# Patient Record
Sex: Female | Born: 1990 | Race: White | Hispanic: No | Marital: Single | Smoking: Current every day smoker
Health system: Southern US, Community
[De-identification: ages and names within clinical notes are randomized; demographics above are authoritative.]

## PROBLEM LIST (undated history)

## (undated) DIAGNOSIS — A749 Chlamydial infection, unspecified: Secondary | ICD-10-CM

## (undated) DIAGNOSIS — F32A Depression, unspecified: Secondary | ICD-10-CM

## (undated) DIAGNOSIS — R599 Enlarged lymph nodes, unspecified: Secondary | ICD-10-CM

## (undated) DIAGNOSIS — G43909 Migraine, unspecified, not intractable, without status migrainosus: Secondary | ICD-10-CM

## (undated) DIAGNOSIS — M549 Dorsalgia, unspecified: Secondary | ICD-10-CM

## (undated) DIAGNOSIS — A5901 Trichomonal vulvovaginitis: Secondary | ICD-10-CM

## (undated) DIAGNOSIS — F329 Major depressive disorder, single episode, unspecified: Secondary | ICD-10-CM

## (undated) DIAGNOSIS — N926 Irregular menstruation, unspecified: Secondary | ICD-10-CM

## (undated) DIAGNOSIS — B009 Herpesviral infection, unspecified: Secondary | ICD-10-CM

## (undated) DIAGNOSIS — K589 Irritable bowel syndrome without diarrhea: Secondary | ICD-10-CM

## (undated) DIAGNOSIS — R109 Unspecified abdominal pain: Secondary | ICD-10-CM

## (undated) DIAGNOSIS — N946 Dysmenorrhea, unspecified: Secondary | ICD-10-CM

## (undated) HISTORY — DX: Trichomonal vulvovaginitis: A59.01

## (undated) HISTORY — DX: Irregular menstruation, unspecified: N92.6

## (undated) HISTORY — DX: Unspecified abdominal pain: R10.9

## (undated) HISTORY — DX: Dysmenorrhea, unspecified: N94.6

## (undated) HISTORY — PX: TONSILLECTOMY: SUR1361

## (undated) HISTORY — DX: Enlarged lymph nodes, unspecified: R59.9

---
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
## 2012-06-14 ENCOUNTER — Emergency Department (HOSPITAL_COMMUNITY)
Admission: EM | Admit: 2012-06-14 | Discharge: 2012-06-14 | Disposition: A | Discharge status: Home / Self Care | Payer: Medicaid Other | Attending: Emergency Medicine | Admitting: Emergency Medicine

## 2012-06-14 ENCOUNTER — Encounter (HOSPITAL_COMMUNITY): Payer: Self-pay | Admitting: *Deleted

## 2012-06-14 ENCOUNTER — Emergency Department (HOSPITAL_COMMUNITY): Payer: Medicaid Other

## 2012-06-14 DIAGNOSIS — R059 Cough, unspecified: Secondary | ICD-10-CM | POA: Insufficient documentation

## 2012-06-14 DIAGNOSIS — J3489 Other specified disorders of nose and nasal sinuses: Secondary | ICD-10-CM | POA: Insufficient documentation

## 2012-06-14 DIAGNOSIS — Z8619 Personal history of other infectious and parasitic diseases: Secondary | ICD-10-CM | POA: Insufficient documentation

## 2012-06-14 DIAGNOSIS — R05 Cough: Secondary | ICD-10-CM | POA: Insufficient documentation

## 2012-06-14 DIAGNOSIS — B349 Viral infection, unspecified: Secondary | ICD-10-CM

## 2012-06-14 DIAGNOSIS — J45909 Unspecified asthma, uncomplicated: Secondary | ICD-10-CM | POA: Insufficient documentation

## 2012-06-14 DIAGNOSIS — J069 Acute upper respiratory infection, unspecified: Secondary | ICD-10-CM | POA: Insufficient documentation

## 2012-06-14 DIAGNOSIS — K029 Dental caries, unspecified: Secondary | ICD-10-CM | POA: Insufficient documentation

## 2012-06-14 DIAGNOSIS — B9789 Other viral agents as the cause of diseases classified elsewhere: Secondary | ICD-10-CM | POA: Insufficient documentation

## 2012-06-14 DIAGNOSIS — Z8659 Personal history of other mental and behavioral disorders: Secondary | ICD-10-CM | POA: Insufficient documentation

## 2012-06-14 DIAGNOSIS — F17200 Nicotine dependence, unspecified, uncomplicated: Secondary | ICD-10-CM | POA: Insufficient documentation

## 2012-06-14 HISTORY — DX: Depression, unspecified: F32.A

## 2012-06-14 HISTORY — DX: Major depressive disorder, single episode, unspecified: F32.9

## 2012-06-14 LAB — RAPID STREP SCREEN (MED CTR MEBANE ONLY): Streptococcus, Group A Screen (Direct): NEGATIVE

## 2012-06-14 MED ORDER — BENZONATATE 100 MG PO CAPS: 100.0000 mg | ORAL_CAPSULE | Freq: Three times a day (TID) | ORAL | Status: DC | PRN

## 2012-06-14 MED ORDER — PENICILLIN V POTASSIUM 250 MG PO TABS: 250.0000 mg | ORAL_TABLET | Freq: Four times a day (QID) | ORAL | Status: DC

## 2012-06-14 MED ORDER — TRAMADOL HCL 50 MG PO TABS: 50.0000 mg | ORAL_TABLET | Freq: Four times a day (QID) | ORAL | Status: DC | PRN

## 2012-06-14 MED ORDER — IBUPROFEN 400 MG PO TABS
400.0000 mg | ORAL_TABLET | Freq: Once | ORAL | Status: AC
  Administered 2012-06-14: 400 mg via ORAL
  Filled 2012-06-14: qty 1

## 2012-06-14 MED ORDER — ACETAMINOPHEN 500 MG PO TABS
1000.0000 mg | ORAL_TABLET | Freq: Once | ORAL | Status: AC
  Administered 2012-06-14: 1000 mg via ORAL
  Filled 2012-06-14: qty 2

## 2012-06-14 MED ORDER — ALBUTEROL SULFATE HFA 108 (90 BASE) MCG/ACT IN AERS: 2.0000 | INHALATION_SPRAY | RESPIRATORY_TRACT | Status: DC | PRN

## 2012-06-14 MED ORDER — DEXAMETHASONE 10 MG/ML FOR PEDIATRIC ORAL USE
10.0000 mg | Freq: Once | INTRAMUSCULAR | Status: AC
  Administered 2012-06-14: 10 mg via ORAL
  Filled 2012-06-14: qty 1

## 2012-06-14 NOTE — ED Notes (Signed)
Fever, cough (productive, pale yellow in color), body aches, sore throat. Symptoms began 3 days ago.

## 2012-06-14 NOTE — ED Provider Notes (Signed)
History     CSN: [id number]  Arrival date & time 06/14/12  1612   First MD Initiated Contact with Patient 06/14/12 1642      Chief Complaint  Patient presents with  . Fever  . Cough    HPI Pt was seen at 1720.   Per pt, c/o gradual onset and persistence of constant subjective fever/chills, sore throat, runny/stuffy nose, sinus congestion, generalized body aches/fatigue and cough for the past 3 days.  Denies rash, no CP/SOB, no N/V/D, no abd pain, no back pain.    Did not get the flu shot this year Past Medical History  Diagnosis Date  . Asthma   . Chlamydia   . Depression     Past Surgical History  Procedure Date  . Tonsillectomy     History  Substance Use Topics  . Smoking status: Current Some Day Smoker  . Smokeless tobacco: Not on file  . Alcohol Use: No    Review of Systems ROS: Statement: All systems negative except as marked or noted in the HPI; Constitutional: +subjective fever and chills, generalized body aches/fatigue. ; ; Eyes: Negative for eye pain, redness and discharge. ; ; ENMT: Negative for ear pain, hoarseness, +nasal congestion, sinus pressure and sore throat. ; ; Cardiovascular: Negative for chest pain, palpitations, diaphoresis, dyspnea and peripheral edema. ; ; Respiratory: +cough. Negative for wheezing and stridor. ; ; Gastrointestinal: Negative for nausea, vomiting, diarrhea, abdominal pain, blood in stool, hematemesis, jaundice and rectal bleeding. . ; ; Genitourinary: Negative for dysuria, flank pain and hematuria. ; ; Musculoskeletal: Negative for back pain and neck pain. Negative for swelling and trauma.; ; Skin: Negative for pruritus, rash, abrasions, blisters, bruising and skin lesion.; ; Neuro: Negative for headache, lightheadedness and neck stiffness. Negative for weakness, altered level of consciousness , altered mental status, extremity weakness, paresthesias, involuntary movement, seizure and syncope.       Allergies  Raspberry and  Other  Home Medications   Current Outpatient Rx  Name  Route  Sig  Dispense  Refill  . ACETAMINOPHEN 500 MG PO TABS   Oral   Take 1,000 mg by mouth every 6 (six) hours as needed. pain         . BENZONATATE 100 MG PO CAPS   Oral   Take 1 capsule (100 mg total) by mouth 3 (three) times daily as needed for cough.   15 capsule   0     BP 126/80  Pulse 107  Temp 98.4 F (36.9 C) (Oral)  Resp 16  Ht 5\' 4"  (1.626 m)  Wt 121 lb 11.2 oz (55.203 kg)  BMI 20.89 kg/m2  SpO2 100%  LMP 06/05/2012  Physical Exam 1725: Physical examination:  Nursing notes reviewed; Vital signs and O2 SAT reviewed;  Constitutional: Well developed, Well nourished, Well hydrated, In no acute distress; Head:  Normocephalic, atraumatic; Eyes: EOMI, PERRL, No scleral icterus; ENMT: TM's clear bilat. +edemetous nasal turbinates bilat with clear rhinorrhea. +mild posterior pharyngeal erythema. Mouth and pharynx without lesions. No tonsillar exudates. No intra-oral edema. No hoarse voice, no drooling, no stridor. No pain with manipulation of larynx. Mouth and pharynx normal, Mucous membranes moist; Neck: Supple, Full range of motion, No lymphadenopathy; Cardiovascular: Regular rate and rhythm, No murmur, rub, or gallop; Respiratory: Breath sounds clear & equal bilaterally, No rales, rhonchi, wheezes.  Speaking full sentences with ease, Normal respiratory effort/excursion; Chest: Nontender, Movement normal; Abdomen: Soft, Nontender, Nondistended, Normal bowel sounds;; Extremities: Pulses normal, No tenderness, No edema,  No calf edema or asymmetry.; Neuro: AA&Ox3, Major CN grossly intact.  Speech clear. No gross focal motor or sensory deficits in extremities.; Skin: Color normal, Warm, Dry.   ED Course  Procedures    MDM  MDM Reviewed: nursing note and vitals Interpretation: labs and x-ray   Results for orders placed during the hospital encounter of 06/14/12  RAPID STREP SCREEN      Component Value Range    Streptococcus, Group A Screen (Direct) NEGATIVE  NEGATIVE   Dg Chest 2 View 06/14/2012  *RADIOLOGY REPORT*  Clinical Data: Cough with sore throat.  CHEST - 2 VIEW  Comparison: 12/24/2009  Findings:  The heart size and mediastinal contours are within normal limits.  Both lungs are clear.  The visualized skeletal structures are unremarkable. No change from priors.  IMPRESSION: No active cardiopulmonary disease.   Original Report Authenticated By: Davonna Belling, M.D.       1735:  Appears viral URI at this time; tx symptomatically.  Pt states she has been going to [facility], and now another family member's home, to visit a newborn child.  Pt and family strongly cautioned regarding not having any further contact with a family member's newborn child while she is ill.  Verb understanding. Dx and testing d/w pt and family.  Questions answered.  Verb understanding, agreeable to d/c home with outpt f/u.   1750:  Pt now c/o "just noticing" a "hole in my tooth" this morning.  ENMT:  Poor dentition, Widespread dental decay, +upper right lateral 2nd molar with dental decay.  No gingival erythema, edema, fluctuance, or drainage. Will tx abx, pain meds; strongly encouraged to f/u with Dentist for definitive care.  Pt also requesting a refill for her albuterol inhaler; rx written.     Laray Anger, DO 06/16/12 1327

## 2012-11-03 ENCOUNTER — Emergency Department (HOSPITAL_COMMUNITY)
Admission: EM | Admit: 2012-11-03 | Discharge: 2012-11-03 | Disposition: A | Discharge status: Home / Self Care | Payer: Medicaid Other | Attending: Emergency Medicine | Admitting: Emergency Medicine

## 2012-11-03 ENCOUNTER — Emergency Department (HOSPITAL_COMMUNITY): Payer: Medicaid Other

## 2012-11-03 ENCOUNTER — Encounter (HOSPITAL_COMMUNITY): Payer: Self-pay | Admitting: *Deleted

## 2012-11-03 DIAGNOSIS — R0789 Other chest pain: Secondary | ICD-10-CM

## 2012-11-03 DIAGNOSIS — Z79899 Other long term (current) drug therapy: Secondary | ICD-10-CM | POA: Insufficient documentation

## 2012-11-03 DIAGNOSIS — R109 Unspecified abdominal pain: Secondary | ICD-10-CM

## 2012-11-03 DIAGNOSIS — M2550 Pain in unspecified joint: Secondary | ICD-10-CM

## 2012-11-03 DIAGNOSIS — Z792 Long term (current) use of antibiotics: Secondary | ICD-10-CM | POA: Insufficient documentation

## 2012-11-03 DIAGNOSIS — S298XXA Other specified injuries of thorax, initial encounter: Secondary | ICD-10-CM | POA: Insufficient documentation

## 2012-11-03 DIAGNOSIS — F17200 Nicotine dependence, unspecified, uncomplicated: Secondary | ICD-10-CM | POA: Insufficient documentation

## 2012-11-03 DIAGNOSIS — S0990XA Unspecified injury of head, initial encounter: Secondary | ICD-10-CM | POA: Insufficient documentation

## 2012-11-03 DIAGNOSIS — J45909 Unspecified asthma, uncomplicated: Secondary | ICD-10-CM | POA: Insufficient documentation

## 2012-11-03 DIAGNOSIS — IMO0002 Reserved for concepts with insufficient information to code with codable children: Secondary | ICD-10-CM | POA: Insufficient documentation

## 2012-11-03 DIAGNOSIS — N898 Other specified noninflammatory disorders of vagina: Secondary | ICD-10-CM | POA: Insufficient documentation

## 2012-11-03 DIAGNOSIS — Z8619 Personal history of other infectious and parasitic diseases: Secondary | ICD-10-CM | POA: Insufficient documentation

## 2012-11-03 DIAGNOSIS — Z8659 Personal history of other mental and behavioral disorders: Secondary | ICD-10-CM | POA: Insufficient documentation

## 2012-11-03 DIAGNOSIS — S3981XA Other specified injuries of abdomen, initial encounter: Secondary | ICD-10-CM | POA: Insufficient documentation

## 2012-11-03 LAB — URINALYSIS, ROUTINE W REFLEX MICROSCOPIC
Glucose, UA: NEGATIVE mg/dL
Ketones, ur: NEGATIVE mg/dL
Leukocytes, UA: NEGATIVE
pH: 6 (ref 5.0–8.0)

## 2012-11-03 LAB — URINE MICROSCOPIC-ADD ON

## 2012-11-03 MED ORDER — IBUPROFEN 800 MG PO TABS
800.0000 mg | ORAL_TABLET | Freq: Once | ORAL | Status: AC
  Administered 2012-11-03: 800 mg via ORAL
  Filled 2012-11-03: qty 1
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
## 2012-11-03 NOTE — ED Notes (Signed)
Ambulated slowly but fully upright to bathroom and back to room
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
## 2012-11-17 ENCOUNTER — Encounter (HOSPITAL_COMMUNITY): Payer: Self-pay | Admitting: *Deleted

## 2012-11-17 ENCOUNTER — Emergency Department (HOSPITAL_COMMUNITY)
Admission: EM | Admit: 2012-11-17 | Discharge: 2012-11-18 | Disposition: A | Discharge status: Home / Self Care | Payer: Medicaid Other | Attending: Emergency Medicine | Admitting: Emergency Medicine

## 2012-11-17 DIAGNOSIS — Y9389 Activity, other specified: Secondary | ICD-10-CM | POA: Insufficient documentation

## 2012-11-17 DIAGNOSIS — Y929 Unspecified place or not applicable: Secondary | ICD-10-CM | POA: Insufficient documentation

## 2012-11-17 DIAGNOSIS — M545 Low back pain: Secondary | ICD-10-CM

## 2012-11-17 DIAGNOSIS — F17200 Nicotine dependence, unspecified, uncomplicated: Secondary | ICD-10-CM | POA: Insufficient documentation

## 2012-11-17 DIAGNOSIS — S29012A Strain of muscle and tendon of back wall of thorax, initial encounter: Secondary | ICD-10-CM

## 2012-11-17 DIAGNOSIS — J45909 Unspecified asthma, uncomplicated: Secondary | ICD-10-CM | POA: Insufficient documentation

## 2012-11-17 DIAGNOSIS — Z8719 Personal history of other diseases of the digestive system: Secondary | ICD-10-CM | POA: Insufficient documentation

## 2012-11-17 DIAGNOSIS — Z8659 Personal history of other mental and behavioral disorders: Secondary | ICD-10-CM | POA: Insufficient documentation

## 2012-11-17 DIAGNOSIS — Z8619 Personal history of other infectious and parasitic diseases: Secondary | ICD-10-CM | POA: Insufficient documentation

## 2012-11-17 DIAGNOSIS — Z8679 Personal history of other diseases of the circulatory system: Secondary | ICD-10-CM | POA: Insufficient documentation

## 2012-11-17 DIAGNOSIS — X500XXA Overexertion from strenuous movement or load, initial encounter: Secondary | ICD-10-CM | POA: Insufficient documentation

## 2012-11-17 DIAGNOSIS — S239XXA Sprain of unspecified parts of thorax, initial encounter: Secondary | ICD-10-CM | POA: Insufficient documentation

## 2012-11-17 DIAGNOSIS — S335XXA Sprain of ligaments of lumbar spine, initial encounter: Secondary | ICD-10-CM | POA: Insufficient documentation

## 2012-11-17 HISTORY — DX: Irritable bowel syndrome, unspecified: K58.9

## 2012-11-17 HISTORY — DX: Dorsalgia, unspecified: M54.9

## 2012-11-17 HISTORY — DX: Migraine, unspecified, not intractable, without status migrainosus: G43.909

## 2012-11-17 NOTE — ED Notes (Signed)
Back pain chronic, but today when helping move a  [employer] , had increase in pain, Crying at triage.

## 2012-11-17 NOTE — ED Provider Notes (Signed)
History    CSN: [id number] Arrival date & time 11/17/12  2315  First MD Initiated Contact with Patient 11/17/12 2330     Chief Complaint  Patient presents with  . Back Pain   (Consider location/radiation/quality/duration/timing/severity/associated sxs/prior Treatment) Patient is a 22 y.o. female presenting with back pain. The history is provided by the patient.  Back Pain Location:  Lumbar spine and thoracic spine Quality:  Aching and stabbing Pain severity:  Severe Onset quality:  Sudden Duration:  3 hours Timing:  Constant Progression:  Unchanged Chronicity:  New Context: lifting heavy objects   Relieved by:  Nothing Associated symptoms: no abdominal pain, no bladder incontinence, no bowel incontinence, no dysuria and no fever  Leg pain: right.    Mary [name] is a 22 y.o. female who presents to the ED with back pain. She states that she has always had back pain due to scoliosis but today while helping move a couch felt a sharp in that started in the upper back and radiates all the way down to the hips and to right leg. She has taken one of her sister's xanax and tylenol without relief. She denies loss of control of bladder or bowels, no nausea or vomiting.  Past Medical History  Diagnosis Date  . Asthma   . Chlamydia   . Depression   . Migraine   . Back pain   . IBS (irritable bowel syndrome)    Past Surgical History  Procedure Laterality Date  . Tonsillectomy     History reviewed. No pertinent family history. History  Substance Use Topics  . Smoking status: Current Some Day Smoker  . Smokeless tobacco: Not on file  . Alcohol Use: No   OB History   Grav Para Term Preterm Abortions TAB SAB Ect Mult Living                 Review of Systems  Constitutional: Negative for fever and chills.  Gastrointestinal: Negative for nausea, vomiting, abdominal pain and bowel incontinence.  Genitourinary: Negative for bladder incontinence and dysuria.  Musculoskeletal:  Positive for back pain.  Skin: Negative for wound.  Neurological: Negative for dizziness.  Psychiatric/Behavioral: The patient is not nervous/anxious.     Allergies  Raspberry and Other  Home Medications   Current Outpatient Rx  Name  Route  Sig  Dispense  Refill  . albuterol (PROVENTIL HFA;VENTOLIN HFA) 108 (90 BASE) MCG/ACT inhaler   Inhalation   Inhale 2 puffs into the lungs every 4 (four) hours as needed for wheezing.   1 Inhaler   0    BP 120/96  Pulse 120  Temp(Src) 99.1 F (37.3 C) (Oral)  Resp 20  Ht 5' (1.524 m)  Wt 121 lb (54.885 kg)  BMI 23.63 kg/m2  SpO2 100%  LMP 11/08/2012 Physical Exam  Nursing note and vitals reviewed. Constitutional: She is oriented to person, place, and time. She appears well-developed and well-nourished. No distress.  HENT:  Head: Normocephalic.  Eyes: EOM are normal.  Neck: Normal range of motion. Neck supple.  Cardiovascular: Normal rate and regular rhythm.   Pulmonary/Chest: Effort normal and breath sounds normal.  Abdominal: Soft. Bowel sounds are normal. There is no tenderness.  Musculoskeletal:       Lumbar back: She exhibits decreased range of motion, tenderness and spasm. She exhibits normal pulse.       Back:  Radial and pedal pulses strong. Adequate circulation. Good touch sensation.  Neurological: She is alert and oriented to  person, place, and time. She has normal strength and normal reflexes. No cranial nerve deficit or sensory deficit. Gait normal.  Skin: Skin is warm and dry.  Multiple tattoos and piercing   Psychiatric: She has a normal mood and affect. Her behavior is normal.    ED Course  Procedures  MDM  22 y.o. female with muscle strain and spasm of thoracic and lumbar area. Will treat with muscle relaxants and pain management and she will follow up with Dr. Hilda Lias. She has seen him in the past with her back problems.  Discussed with the patient clinical findings and plan of care. All questioned fully  answered. She will return if any problems arise.    Medication List    TAKE these medications       cyclobenzaprine 10 MG tablet  Commonly known as:  FLEXERIL  Take 1 tablet (10 mg total) by mouth 2 (two) times daily as needed for muscle spasms.     HYDROcodone-acetaminophen 5-325 MG per tablet  Commonly known as:  NORCO/VICODIN  Take 1 tablet by mouth every 4 (four) hours as needed.      ASK your doctor about these medications       albuterol 108 (90 BASE) MCG/ACT inhaler  Commonly known as:  PROVENTIL HFA;VENTOLIN HFA  Inhale 2 puffs into the lungs every 4 (four) hours as needed for wheezing.           [facility] Orlene Och, [place] 11/18/12 1836

## 2012-11-18 ENCOUNTER — Other Ambulatory Visit (HOSPITAL_COMMUNITY): Payer: Self-pay | Admitting: Family Medicine

## 2012-11-18 ENCOUNTER — Ambulatory Visit (HOSPITAL_COMMUNITY)
Admission: RE | Admit: 2012-11-18 | Discharge: 2012-11-18 | Disposition: A | Discharge status: Home / Self Care | Payer: Medicaid Other | Source: Ambulatory Visit | Attending: Family Medicine | Admitting: Family Medicine

## 2012-11-18 DIAGNOSIS — IMO0002 Reserved for concepts with insufficient information to code with codable children: Secondary | ICD-10-CM

## 2012-11-18 DIAGNOSIS — M4120 Other idiopathic scoliosis, site unspecified: Secondary | ICD-10-CM | POA: Insufficient documentation

## 2012-11-18 DIAGNOSIS — M546 Pain in thoracic spine: Secondary | ICD-10-CM | POA: Insufficient documentation

## 2012-11-18 DIAGNOSIS — M545 Low back pain, unspecified: Secondary | ICD-10-CM | POA: Insufficient documentation

## 2012-11-18 MED ORDER — HYDROCODONE-ACETAMINOPHEN 5-325 MG PO TABS: 1.0000 | ORAL_TABLET | ORAL | Status: DC | PRN

## 2012-11-18 MED ORDER — HYDROCODONE-ACETAMINOPHEN 5-325 MG PO TABS
1.0000 | ORAL_TABLET | Freq: Once | ORAL | Status: AC
  Administered 2012-11-18: 1 via ORAL
  Filled 2012-11-18: qty 1

## 2012-11-18 MED ORDER — CYCLOBENZAPRINE HCL 10 MG PO TABS: 10.0000 mg | ORAL_TABLET | Freq: Two times a day (BID) | ORAL | Status: DC | PRN

## 2012-11-18 MED ORDER — CYCLOBENZAPRINE HCL 10 MG PO TABS
10.0000 mg | ORAL_TABLET | Freq: Once | ORAL | Status: AC
  Administered 2012-11-18: 10 mg via ORAL
  Filled 2012-11-18: qty 1

## 2012-11-19 NOTE — ED Provider Notes (Signed)
Medical screening examination/treatment/procedure(s) were performed by non-physician practitioner and as supervising physician I was immediately available for consultation/collaboration.  Nicoletta Dress. Colon Branch, MD 11/19/12 [phone]

## 2012-12-01 ENCOUNTER — Emergency Department (HOSPITAL_COMMUNITY): Payer: Medicaid Other

## 2012-12-01 ENCOUNTER — Emergency Department (HOSPITAL_COMMUNITY)
Admission: EM | Admit: 2012-12-01 | Discharge: 2012-12-01 | Disposition: A | Discharge status: Home / Self Care | Payer: Medicaid Other | Attending: Emergency Medicine | Admitting: Emergency Medicine

## 2012-12-01 ENCOUNTER — Encounter (HOSPITAL_COMMUNITY): Payer: Self-pay

## 2012-12-01 DIAGNOSIS — J45901 Unspecified asthma with (acute) exacerbation: Secondary | ICD-10-CM | POA: Insufficient documentation

## 2012-12-01 DIAGNOSIS — Z8719 Personal history of other diseases of the digestive system: Secondary | ICD-10-CM | POA: Insufficient documentation

## 2012-12-01 DIAGNOSIS — J209 Acute bronchitis, unspecified: Secondary | ICD-10-CM | POA: Insufficient documentation

## 2012-12-01 DIAGNOSIS — F3289 Other specified depressive episodes: Secondary | ICD-10-CM | POA: Insufficient documentation

## 2012-12-01 DIAGNOSIS — Z8619 Personal history of other infectious and parasitic diseases: Secondary | ICD-10-CM | POA: Insufficient documentation

## 2012-12-01 DIAGNOSIS — F329 Major depressive disorder, single episode, unspecified: Secondary | ICD-10-CM | POA: Insufficient documentation

## 2012-12-01 DIAGNOSIS — F17200 Nicotine dependence, unspecified, uncomplicated: Secondary | ICD-10-CM | POA: Insufficient documentation

## 2012-12-01 DIAGNOSIS — Z8679 Personal history of other diseases of the circulatory system: Secondary | ICD-10-CM | POA: Insufficient documentation

## 2012-12-01 DIAGNOSIS — Z79899 Other long term (current) drug therapy: Secondary | ICD-10-CM | POA: Insufficient documentation

## 2012-12-01 DIAGNOSIS — Z8739 Personal history of other diseases of the musculoskeletal system and connective tissue: Secondary | ICD-10-CM | POA: Insufficient documentation

## 2012-12-01 DIAGNOSIS — J40 Bronchitis, not specified as acute or chronic: Secondary | ICD-10-CM

## 2012-12-01 DIAGNOSIS — R51 Headache: Secondary | ICD-10-CM | POA: Insufficient documentation

## 2012-12-01 MED ORDER — HYDROCODONE-ACETAMINOPHEN 5-325 MG PO TABS
1.0000 | ORAL_TABLET | Freq: Once | ORAL | Status: AC
  Administered 2012-12-01: 1 via ORAL
  Filled 2012-12-01: qty 1

## 2012-12-01 MED ORDER — AMOXICILLIN 500 MG PO CAPS: 500.0000 mg | ORAL_CAPSULE | Freq: Three times a day (TID) | ORAL | Status: DC

## 2012-12-01 MED ORDER — TRAMADOL HCL 50 MG PO TABS: 50.0000 mg | ORAL_TABLET | Freq: Four times a day (QID) | ORAL | Status: DC | PRN
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
## 2012-12-01 NOTE — ED Notes (Signed)
Pt reports cough for 4 days, and headache that started today, inhaler is nit helping her. Chills at times, sob. Coughing up yellow mucus, no fever.

## 2013-02-20 ENCOUNTER — Encounter (HOSPITAL_COMMUNITY): Payer: Self-pay | Admitting: *Deleted

## 2013-02-20 ENCOUNTER — Emergency Department (HOSPITAL_COMMUNITY)
Admission: EM | Admit: 2013-02-20 | Discharge: 2013-02-20 | Disposition: A | Discharge status: Home / Self Care | Payer: Medicaid Other | Attending: Emergency Medicine | Admitting: Emergency Medicine

## 2013-02-20 DIAGNOSIS — Z8619 Personal history of other infectious and parasitic diseases: Secondary | ICD-10-CM | POA: Insufficient documentation

## 2013-02-20 DIAGNOSIS — G43909 Migraine, unspecified, not intractable, without status migrainosus: Secondary | ICD-10-CM | POA: Insufficient documentation

## 2013-02-20 DIAGNOSIS — R5381 Other malaise: Secondary | ICD-10-CM | POA: Insufficient documentation

## 2013-02-20 DIAGNOSIS — F329 Major depressive disorder, single episode, unspecified: Secondary | ICD-10-CM | POA: Insufficient documentation

## 2013-02-20 DIAGNOSIS — Z792 Long term (current) use of antibiotics: Secondary | ICD-10-CM | POA: Insufficient documentation

## 2013-02-20 DIAGNOSIS — Z79899 Other long term (current) drug therapy: Secondary | ICD-10-CM | POA: Insufficient documentation

## 2013-02-20 DIAGNOSIS — Z8719 Personal history of other diseases of the digestive system: Secondary | ICD-10-CM | POA: Insufficient documentation

## 2013-02-20 DIAGNOSIS — J45909 Unspecified asthma, uncomplicated: Secondary | ICD-10-CM | POA: Insufficient documentation

## 2013-02-20 DIAGNOSIS — F3289 Other specified depressive episodes: Secondary | ICD-10-CM | POA: Insufficient documentation

## 2013-02-20 DIAGNOSIS — F17200 Nicotine dependence, unspecified, uncomplicated: Secondary | ICD-10-CM | POA: Insufficient documentation

## 2013-02-20 DIAGNOSIS — R531 Weakness: Secondary | ICD-10-CM

## 2013-02-20 DIAGNOSIS — R1110 Vomiting, unspecified: Secondary | ICD-10-CM | POA: Insufficient documentation

## 2013-02-20 LAB — CBC WITH DIFFERENTIAL/PLATELET
Basophils Absolute: 0 10*3/uL (ref 0.0–0.1)
Basophils Relative: 0 % (ref 0–1)
HCT: 35.4 % — ABNORMAL LOW (ref 36.0–46.0)
Hemoglobin: 11.5 g/dL — ABNORMAL LOW (ref 12.0–15.0)
Lymphocytes Relative: 42 % (ref 12–46)
Lymphs Abs: 2.8 10*3/uL (ref 0.7–4.0)
Monocytes Absolute: 0.6 10*3/uL (ref 0.1–1.0)
Monocytes Relative: 9 % (ref 3–12)
Neutro Abs: 3 10*3/uL (ref 1.7–7.7)
Neutrophils Relative %: 45 % (ref 43–77)
RBC: 3.98 MIL/uL (ref 3.87–5.11)
WBC: 6.7 10*3/uL (ref 4.0–10.5)

## 2013-02-20 LAB — RAPID URINE DRUG SCREEN, HOSP PERFORMED
Amphetamines: NOT DETECTED
Barbiturates: NOT DETECTED
Benzodiazepines: NOT DETECTED
Cocaine: NOT DETECTED
Opiates: NOT DETECTED

## 2013-02-20 LAB — GLUCOSE, CAPILLARY: Glucose-Capillary: 118 mg/dL — ABNORMAL HIGH (ref 70–99)

## 2013-02-20 LAB — BASIC METABOLIC PANEL
BUN: 6 mg/dL (ref 6–23)
CO2: 25 mEq/L (ref 19–32)
Chloride: 108 mEq/L (ref 96–112)
Creatinine, Ser: 0.8 mg/dL (ref 0.50–1.10)
GFR calc Af Amer: >90 mL/min (ref >90)
Potassium: 3 mEq/L — ABNORMAL LOW (ref 3.5–5.1)
Sodium: 144 mEq/L (ref 135–145)

## 2013-02-20 LAB — ETHANOL: Alcohol, Ethyl (B): <11 mg/dL (ref 0–11)

## 2013-02-20 LAB — SALICYLATE LEVEL: Salicylate Lvl: <2 mg/dL — ABNORMAL LOW (ref 2.8–20.0)

## 2013-02-20 MED ORDER — SODIUM CHLORIDE 0.9 % IV BOLUS (SEPSIS)
1000.0000 mL | Freq: Once | INTRAVENOUS | Status: AC
  Administered 2013-02-20: 1000 mL via INTRAVENOUS

## 2013-02-20 MED ORDER — NALOXONE HCL 1 MG/ML IJ SOLN
1.0000 mg | Freq: Once | INTRAMUSCULAR | Status: AC
  Administered 2013-02-20: 1 mg via INTRAVENOUS

## 2013-02-20 MED ORDER — NALOXONE HCL 1 MG/ML IJ SOLN
INTRAMUSCULAR | Status: AC
  Administered 2013-02-20: 1 mg via INTRAVENOUS
  Filled 2013-02-20: qty 2

## 2013-02-20 NOTE — ED Provider Notes (Signed)
CSN: [id number]     Arrival date & time 02/20/13  0712 History  This chart was scribed for Mary Hutching, MD by Shari Heritage, ED Scribe. The patient was seen in room APA16A/APA16A. Patient's care was started at 7:28 AM.    Chief Complaint  Patient presents with  . Alcohol Intoxication    The history is provided by the EMS personnel. The history is limited by the condition of the patient. No language interpreter was used.   Level 5 Caveat - Unable to obtain complete history due to patient's condition, unresponsive HPI Comments: Mary [name] is a 22 y.o. female who presents to the Emergency Department brought in by EMS after being found on the side of the road vomiting. Per EMS report, while patient was being transported, she fell asleep after previously being alert. She was given Narcan 2 mg en route, but she did not respond to this medicine. The officer on the scene believed she had been using alcohol. Patient has not provided any information at this time.    Past Medical History  Diagnosis Date  . Asthma   . Chlamydia   . Depression   . Migraine   . Back pain   . IBS (irritable bowel syndrome)    Past Surgical History  Procedure Laterality Date  . Tonsillectomy     No family history on file. History  Substance Use Topics  . Smoking status: Current Some Day Smoker    Types: Cigarettes  . Smokeless tobacco: Not on file  . Alcohol Use: No   OB History   Grav Para Term Preterm Abortions TAB SAB Ect Mult Living                 Review of Systems Level 5 Caveat - Unable to complete ROS due to patient's condition, unresponsive  Allergies  Raspberry and Other  Home Medications   Current Outpatient Rx  Name  Route  Sig  Dispense  Refill  . acetaminophen (TYLENOL) 500 MG tablet   Oral   Take 500 mg by mouth every 6 (six) hours as needed for pain.         Marland Kitchen albuterol (PROVENTIL HFA;VENTOLIN HFA) 108 (90 BASE) MCG/ACT inhaler   Inhalation   Inhale 2 puffs into the lungs  every 4 (four) hours as needed for wheezing.   1 Inhaler   0   . amoxicillin (AMOXIL) 500 MG capsule   Oral   Take 1 capsule (500 mg total) by mouth 3 (three) times daily.   21 capsule   0   . cyclobenzaprine (FLEXERIL) 10 MG tablet   Oral   Take 10 mg by mouth 3 (three) times daily as needed for muscle spasms.         Marland Kitchen FLUoxetine (PROZAC) 20 MG capsule   Oral   Take 20 mg by mouth daily.         . traMADol (ULTRAM) 50 MG tablet   Oral   Take 1 tablet (50 mg total) by mouth every 6 (six) hours as needed for pain.   15 tablet   0    Triage Vitals: BP 139/88  Pulse 61  Temp(Src) 97.3 F (36.3 C) (Oral)  Resp 21  SpO2 93% Physical Exam  Nursing note and vitals reviewed. Constitutional: She appears well-developed and well-nourished.  HENT:  Head: Normocephalic and atraumatic.  Eyes: Conjunctivae and EOM are normal. Pupils are equal, round, and reactive to light.  Neck: Normal range of motion. Neck  supple.  Cardiovascular: Normal rate, regular rhythm and normal heart sounds.   Pulmonary/Chest: Effort normal and breath sounds normal.  Abdominal: Soft. Bowel sounds are normal.  Musculoskeletal: Normal range of motion.  Neurological:  Unable to assess.  Skin: Skin is warm and dry.  Psychiatric: Cognition and memory are impaired.  Unable to assess due to patient's condition.    ED Course  Procedures (including critical care time) DIAGNOSTIC STUDIES: Oxygen Saturation is 93% on room air, adequate by my interpretation.    COORDINATION OF CARE: 7:32 AM- Patient brought in by EMS unresponsive. Will order IV fluids, glucose, acetaminophen level, CBC with diff, BMP, ethanol, drug screen and salicylate level.   8:43 AM- Per ED staff, [place] [employer] located patient's purse which contained heroin paraphernalia.   Labs Review Labs Reviewed  CBC WITH DIFFERENTIAL - Abnormal; Notable for the following:    Hemoglobin 11.5 (*)    HCT 35.4 (*)    All other components  within normal limits  BASIC METABOLIC PANEL - Abnormal; Notable for the following:    Potassium 3.0 (*)    Glucose, Bld 126 (*)    All other components within normal limits  URINE RAPID DRUG SCREEN (HOSP PERFORMED) - Abnormal; Notable for the following:    Tetrahydrocannabinol POSITIVE (*)    All other components within normal limits  SALICYLATE LEVEL - Abnormal; Notable for the following:    Salicylate Lvl <2.0 (*)    All other components within normal limits  GLUCOSE, CAPILLARY - Abnormal; Notable for the following:    Glucose-Capillary 118 (*)    All other components within normal limits  ETHANOL  ACETAMINOPHEN LEVEL    Imaging Review No results found.  MDM  No diagnosis found. Patient rechecked at multiple times throughout her emergency department course.  She seemed to have a good response to Narcan, however no opiates were noted in her drug screen.   At discharge she was alert and ambulatory. No neuro deficits. Vital signs are stable.   I personally performed the services described in this documentation, which was scribed in my presence. The recorded information has been reviewed and is accurate.   Mary Hutching, MD 02/20/13 [phone]

## 2013-02-20 NOTE — ED Notes (Signed)
Pt was standing on the side of the road vomiting when EMS arrived. EMS states that pt was awake and alert when they first arrived but she fell to sleep after laying on the stretcher. Narcan 2mg  was given with no response by EMS. Officer on scene stated that pt had been drinking. Pt is not providing any information at this time.

## 2013-02-20 NOTE — ED Notes (Signed)
Patient lying in bed. No distress. Noted snoring. Per EMS patient found stumbling down side of road.

## 2013-02-20 NOTE — ED Notes (Signed)
Patient resting eyes closed. Respirations even and unlabored.

## 2013-02-20 NOTE — ED Notes (Signed)
Patient arouses to voice. Patient still very drowsy, but carries conversation with RN without difficulty. Vital signs obtained and stable.

## 2013-02-20 NOTE — ED Notes (Signed)
Patient agitated and complaining of pain in legs. More alert. Will reluctantly talk to nurse's at bedside. States she has "restless leg" and cramping in legs. Patient states she takes oxycodone. Shakes head yes to illegal drug question, but will not state what she takes.

## 2013-02-20 NOTE — ED Notes (Signed)
Patient with no complaints at this time. Respirations even and unlabored. Skin warm/dry. Discharge instructions reviewed with patient at this time. Patient given opportunity to voice concerns/ask questions. IV removed per policy and band-aid applied to site. Patient discharged at this time and left Emergency Department with steady gait.  

## 2013-02-20 NOTE — ED Notes (Signed)
Phoned lab re: labs pending for an hour. Phlebotomist overhead paged.

## 2013-02-20 NOTE — ED Notes (Addendum)
[employer] at bedside. Found patient's purse in road near where patient was found. Drug paraphernalia found by [employer] in purse (needles, syringes, burnt spoons). Dr Adriana Simas made aware of PD findings. Verbal orders for 1 mg narcan IV and second 1000 ml normal saline bolus obtained. Patient purse locked up in ED locker.   Track markes noted to right posterior, medial hand.

## 2013-02-20 NOTE — ED Notes (Signed)
Patient awake. Mother at bedside to give patient ride home.

## 2013-02-20 NOTE — ED Notes (Signed)
Patient cloths were wet upon arrival changed patient linens and gown. Patient is asleep and resting at this time.

## 2013-02-20 NOTE — ED Notes (Signed)
Increased patient movement after narcan injection.

## 2013-02-20 NOTE — ED Notes (Signed)
Resting. Hypotensive. Respirations even and unlabored. No distress

## 2013-06-04 ENCOUNTER — Encounter (INDEPENDENT_AMBULATORY_CARE_PROVIDER_SITE_OTHER): Payer: Self-pay

## 2013-06-04 ENCOUNTER — Ambulatory Visit (INDEPENDENT_AMBULATORY_CARE_PROVIDER_SITE_OTHER): Payer: Medicaid Other | Admitting: Adult Health

## 2013-06-04 ENCOUNTER — Encounter: Payer: Self-pay | Admitting: Adult Health

## 2013-06-04 VITALS — BP 120/76 | HR 78 | Temp 98.6°F | Ht 60.0 in | Wt 117.0 lb

## 2013-06-04 DIAGNOSIS — N898 Other specified noninflammatory disorders of vagina: Secondary | ICD-10-CM

## 2013-06-04 DIAGNOSIS — A5901 Trichomonal vulvovaginitis: Secondary | ICD-10-CM

## 2013-06-04 DIAGNOSIS — N949 Unspecified condition associated with female genital organs and menstrual cycle: Secondary | ICD-10-CM

## 2013-06-04 DIAGNOSIS — N946 Dysmenorrhea, unspecified: Secondary | ICD-10-CM | POA: Insufficient documentation

## 2013-06-04 DIAGNOSIS — Z3202 Encounter for pregnancy test, result negative: Secondary | ICD-10-CM

## 2013-06-04 DIAGNOSIS — Z3200 Encounter for pregnancy test, result unknown: Secondary | ICD-10-CM

## 2013-06-04 DIAGNOSIS — R599 Enlarged lymph nodes, unspecified: Secondary | ICD-10-CM

## 2013-06-04 HISTORY — DX: Dysmenorrhea, unspecified: N94.6

## 2013-06-04 HISTORY — DX: Trichomonal vulvovaginitis: A59.01

## 2013-06-04 HISTORY — DX: Enlarged lymph nodes, unspecified: R59.9

## 2013-06-04 LAB — CBC
HCT: 36.6 % (ref 36.0–46.0)
Hemoglobin: 12.6 g/dL (ref 12.0–15.0)
MCH: 28.4 pg (ref 26.0–34.0)
MCHC: 34.4 g/dL (ref 30.0–36.0)
MCV: 82.4 fL (ref 78.0–100.0)
PLATELETS: 177 10*3/uL (ref 150–400)
RBC: 4.44 MIL/uL (ref 3.87–5.11)
RDW: 15 % (ref 11.5–15.5)
WBC: 10.8 10*3/uL — ABNORMAL HIGH (ref 4.0–10.5)

## 2013-06-04 LAB — POCT URINE PREGNANCY: Preg Test, Ur: NEGATIVE

## 2013-06-04 MED ORDER — HYDROCODONE-ACETAMINOPHEN 10-325 MG PO TABS: 1.0000 | ORAL_TABLET | Freq: Four times a day (QID) | ORAL | Status: DC | PRN

## 2013-06-04 MED ORDER — AZITHROMYCIN 250 MG PO TABS: ORAL_TABLET | ORAL | Status: DC

## 2013-06-04 MED ORDER — CEFTRIAXONE SODIUM 1 G IJ SOLR
1.0000 g | Freq: Once | INTRAMUSCULAR | Status: AC
  Administered 2013-06-04: 1 g via INTRAMUSCULAR

## 2013-06-04 MED ORDER — METRONIDAZOLE 500 MG PO TABS: ORAL_TABLET | ORAL | Status: DC

## 2013-06-04 NOTE — Progress Notes (Signed)
Subjective:     Patient ID: Mary [name], female   DOB: 1990-06-07, 23 y.o.   MRN: [id number]  HPI Mary [name] is a 23 year old white female in complaining of ingrown hair and painful periods and pain in pelvic area, ? Swollen glands.Had sex about a week ago.Pt popped area where hair was and it is better.  Review of Systems See HPI Reviewed past medical,surgical, social and family history. Reviewed medications and allergies.     Objective:   Physical Exam BP 120/76  Pulse 78  Temp(Src) 98.6 F (37 C) (Oral)  Ht 5' (1.524 m)  Wt 117 lb (53.071 kg)  BMI 22.85 kg/m2  LMP 01/07/2015UPT negative, Skin warm and dry.Pelvic: external genitalia is normal in appearance,Has enlarged glands bilaterally L>R,and tender, vagina: grayish frothy discharge with odor, cervix:smooth and bulbous,negative CMT, uterus: normal size, shape and contour, tender, no masses felt, adnexa: no masses, some tenderness noted. Wet prep: + for trich and +WBCs. GC/CHL obtained.     Assessment:    Vaginal discharge Trichomonas Enlarged bilateral groin lymph nodes Dysmenorrhea Pelvic pain    Plan:     Rocephin 1 gm IM in left hip per A Travis LPN Rx Flagyl 277 mg # 4  4 po now, no alcohol Rx azithromycin 250 mg # 4 4 po about 1 hour after flagyl, no sex Check CBC,ESR and GC/CHL Rx norco 10/325 mg #30 1 every 6 hours prn no refills follow up in 1 day Review handout on trich and tell partners to be treated

## 2013-06-04 NOTE — Patient Instructions (Addendum)
Take flagyl 4 tabs now then in about 1 hour take 4 Azithromycin po, no alcohol or sex, tell partners to get treated for trich Trichomoniasis Trichomoniasis is an infection, caused by the Trichomonas organism, that affects both women and men. In women, the outer female genitalia and the vagina are affected. In men, the penis is mainly affected, but the prostate and other reproductive organs can also be involved. Trichomoniasis is a sexually transmitted disease (STD) and is most often passed to another person through sexual contact. The majority of people who get trichomoniasis do so from a sexual encounter and are also at risk for other STDs. CAUSES   Sexual intercourse with an infected partner.  It can be present in swimming pools or hot tubs. SYMPTOMS   Abnormal gray-green frothy vaginal discharge in women.  Vaginal itching and irritation in women.  Itching and irritation of the area outside the vagina in women.  Penile discharge with or without pain in males.  Inflammation of the urethra (urethritis), causing painful urination.  Bleeding after sexual intercourse. RELATED COMPLICATIONS  Pelvic inflammatory disease.  Infection of the uterus (endometritis).  Infertility.  Tubal (ectopic) pregnancy.  It can be associated with other STDs, including gonorrhea and chlamydia, hepatitis B, and HIV. COMPLICATIONS DURING PREGNANCY  Early (premature) delivery.  Premature rupture of the membranes (PROM).  Low birth weight. DIAGNOSIS   Visualization of Trichomonas under the microscope from the vagina discharge.  Ph of the vagina greater than 4.5, tested with a test tape.  Trich Rapid Test.  Culture of the organism, but this is not usually needed.  It may be found on a Pap test.  Having a "strawberry cervix,"which means the cervix looks very red like a strawberry. TREATMENT   You may be given medication to fight the infection. Inform your caregiver if you could be or are  pregnant. Some medications used to treat the infection should not be taken during pregnancy.  Over-the-counter medications or creams to decrease itching or irritation may be recommended.  Your sexual partner will need to be treated if infected. HOME CARE INSTRUCTIONS   Take all medication prescribed by your caregiver.  Take over-the-counter medication for itching or irritation as directed by your caregiver.  Do not have sexual intercourse while you have the infection.  Do not douche or wear tampons.  Discuss your infection with your partner, as your partner may have acquired the infection from you. Or, your partner may have been the person who transmitted the infection to you.  Have your sex partner examined and treated if necessary.  Practice safe, informed, and protected sex.  See your caregiver for other STD testing. SEEK MEDICAL CARE IF:   You still have symptoms after you finish the medication.  You have an oral temperature above 102 F (38.9 C).  You develop belly (abdominal) pain.  You have pain when you urinate.  You have bleeding after sexual intercourse.  You develop a rash.  The medication makes you sick or makes you throw up (vomit). Document Released: 11/07/2000 Document Revised: 08/06/2011 Document Reviewed: 12/03/2008 [facility]Exit[facility] Patient Information 2014 [place]Care, [place].  Take only 1 norco every 6 hours Follow up in 1 day

## 2013-06-05 ENCOUNTER — Ambulatory Visit (INDEPENDENT_AMBULATORY_CARE_PROVIDER_SITE_OTHER): Payer: Medicaid Other | Admitting: Adult Health

## 2013-06-05 ENCOUNTER — Encounter: Payer: Self-pay | Admitting: Adult Health

## 2013-06-05 VITALS — BP 108/78 | Ht 60.0 in | Wt 117.0 lb

## 2013-06-05 DIAGNOSIS — A749 Chlamydial infection, unspecified: Secondary | ICD-10-CM | POA: Insufficient documentation

## 2013-06-05 DIAGNOSIS — A5901 Trichomonal vulvovaginitis: Secondary | ICD-10-CM

## 2013-06-05 LAB — GC/CHLAMYDIA PROBE AMP
CT PROBE, AMP APTIMA: POSITIVE — AB
GC Probe RNA: NEGATIVE

## 2013-06-05 LAB — SEDIMENTATION RATE: SED RATE: 47 mm/h — AB (ref 0–22)

## 2013-06-05 NOTE — Progress Notes (Signed)
Subjective:     Patient ID: Mary [name], female   DOB: January 12, 1991, 23 y.o.   MRN: [id number]  HPI Mary [name] is back in follow up, feels much better today,has less pain, more sore.  Review of Systems See HPI Reviewed past medical,surgical, social and family history. Reviewed medications and allergies.     Objective:   Physical Exam BP 108/78  Ht 5' (1.524 m)  Wt 117 lb (53.071 kg)  BMI 22.85 kg/m2  LMP 06/03/2013   reviewed labs with pt she is aware of +chlamydia, she took her meds and will give cards to partners to get treated at health dept for chlamydia and trich  Assessment:     Chlamydia Trichomonas    Plan:     No sex  Return in 2 weeks for proof of treatment   Give cards to partners to get treated

## 2013-06-05 NOTE — Patient Instructions (Signed)
No sex til POC of trich in 2 weeks

## 2013-06-19 ENCOUNTER — Ambulatory Visit: Payer: Medicaid Other | Admitting: Adult Health

## 2013-06-23 ENCOUNTER — Encounter: Payer: Self-pay | Admitting: Adult Health

## 2013-06-23 ENCOUNTER — Ambulatory Visit (INDEPENDENT_AMBULATORY_CARE_PROVIDER_SITE_OTHER): Payer: Medicaid Other | Admitting: Adult Health

## 2013-06-23 VITALS — BP 100/60 | Ht 60.0 in | Wt 115.0 lb

## 2013-06-23 DIAGNOSIS — N898 Other specified noninflammatory disorders of vagina: Secondary | ICD-10-CM

## 2013-06-23 DIAGNOSIS — Z8619 Personal history of other infectious and parasitic diseases: Secondary | ICD-10-CM

## 2013-06-23 DIAGNOSIS — A7489 Other chlamydial diseases: Secondary | ICD-10-CM

## 2013-06-23 DIAGNOSIS — N946 Dysmenorrhea, unspecified: Secondary | ICD-10-CM

## 2013-06-23 LAB — POCT WET PREP (WET MOUNT)
Trichomonas Wet Prep HPF POC: NEGATIVE
WBC, Wet Prep HPF POC: NEGATIVE

## 2013-06-23 MED ORDER — NAPROXEN SODIUM 550 MG PO TABS: 550.0000 mg | ORAL_TABLET | Freq: Three times a day (TID) | ORAL | Status: DC

## 2013-06-23 NOTE — Progress Notes (Signed)
Subjective:     Patient ID: Mary [name], female   DOB: April 29, 1991, 23 y.o.   MRN: [id number]  HPI Mary [name] is a 23 year old white female, single back in for proof of treatment for recent trichomonas and chlamydia infection.Has had sex with partner that did not get treated, and complains of cramps before period and with period.Declines OCs at present, but said she would think about it.  Review of Systems See HPI Reviewed past medical,surgical, social and family history. Reviewed medications and allergies.     Objective:   Physical Exam BP 100/60  Ht 5' (1.524 m)  Wt 115 lb (52.164 kg)  BMI 22.46 kg/m2  LMP 01/07/2015lymph nodes   in groin resolved on left and smaller on right, still tender, Skin warm and dry.Pelvic: external genitalia is normal in appearance, vagina: white discharge without odor, cervix:smooth, -CMT, uterus: normal size, shape and contour, tender, no masses felt, adnexa: no masses or tenderness noted. Wet prep: negative. GC/CHL obtained.  Assessment:     Vaginal discharge History of trichomonas History of chlamydia Dysmenorrhea     Plan:     Rx Anaprox ds #30 1 every 8 hours prn pain with 1 refill   Review handout on dysmenorrhea Think about trying OCs  Return in 1 week for US to assess uterus and see me GC/CHL sent Use condoms and this was stressed

## 2013-06-23 NOTE — Patient Instructions (Signed)
Dysmenorrhea Menstrual cramps (dysmenorrhea) are caused by the muscles of the uterus tightening (contracting) during a menstrual period. For some women, this discomfort is merely bothersome. For others, dysmenorrhea can be severe enough to interfere with everyday activities for a few days each month. Primary dysmenorrhea is menstrual cramps that last a couple of days when you start having menstrual periods or soon after. This often begins after a teenager starts having her period. As a woman gets older or has a baby, the cramps will usually lessen or disappear. Secondary dysmenorrhea begins later in life, lasts longer, and the pain may be stronger than primary dysmenorrhea. The pain may start before the period and last a few days after the period.  CAUSES  Dysmenorrhea is usually caused by an underlying problem, such as:  The tissue lining the uterus grows outside of the uterus in other areas of the body (endometriosis).  The endometrial tissue, which normally lines the uterus, is found in or grows into the muscular walls of the uterus (adenomyosis).  The pelvic blood vessels are engorged with blood just before the menstrual period (pelvic congestive syndrome).  Overgrowth of cells (polyps) in the lining of the uterus or cervix.  Falling down of the uterus (prolapse) because of loose or stretched ligaments.  Depression.  Bladder problems, infection, or inflammation.  Problems with the intestine, a tumor, or irritable bowel syndrome.  Cancer of the female organs or bladder.  A severely tipped uterus.  A very tight opening or closed cervix.  Noncancerous tumors of the uterus (fibroids).  Pelvic inflammatory disease (PID).  Pelvic scarring (adhesions) from a previous surgery.  Ovarian cyst.  An intrauterine device (IUD) used for birth control. RISK FACTORS You may be at greater risk of dysmenorrhea if:  You are younger than age 62.  You started puberty early.  You have  irregular or heavy bleeding.  You have never given birth.  You have a family history of this problem.  You are a smoker. SIGNS AND SYMPTOMS   Cramping or throbbing pain in your lower abdomen.  Headaches.  Lower back pain.  Nausea or vomiting.  Diarrhea.  Sweating or dizziness.  Loose stools. DIAGNOSIS  A diagnosis is based on your history, symptoms, physical exam, diagnostic tests, or procedures. Diagnostic tests or procedures may include:  Blood tests.  Ultrasonography.  An examination of the lining of the uterus (dilation and curettage, D&C).  An examination inside your abdomen or pelvis with a scope (laparoscopy).  X-rays.  CT scan.  MRI.  An examination inside the bladder with a scope (cystoscopy).  An examination inside the intestine or stomach with a scope (colonoscopy, gastroscopy). TREATMENT  Treatment depends on the cause of the dysmenorrhea. Treatment may include:  Pain medicine prescribed by your health care provider.  Birth control pills or an IUD with progesterone hormone in it.  Hormone replacement therapy.  Nonsteroidal anti-inflammatory drugs (NSAIDs). These may help stop the production of prostaglandins.  Surgery to remove adhesions, endometriosis, ovarian cyst, or fibroids.  Removal of the uterus (hysterectomy).  Progesterone shots to stop the menstrual period.  Cutting the nerves on the sacrum that go to the female organs (presacral neurectomy).  Electric current to the sacral nerves (sacral nerve stimulation).  Antidepressant medicine.  Psychiatric therapy, counseling, or group therapy.  Exercise and physical therapy.  Meditation and yoga therapy.  Acupuncture. HOME CARE INSTRUCTIONS   Only take over-the-counter or prescription medicines as directed by your health care provider.  Place a heating pad  or hot water bottle on your lower back or abdomen. Do not sleep with the heating pad.  Use aerobic exercises, walking,  swimming, biking, and other exercises to help lessen the cramping.  Massage to the lower back or abdomen may help.  Stop smoking.  Avoid alcohol and caffeine. SEEK MEDICAL CARE IF:   Your pain does not get better with medicine.  You have pain with sexual intercourse.  Your pain increases and is not controlled with medicines.  You have abnormal vaginal bleeding with your period.  You develop nausea or vomiting with your period that is not controlled with medicine. SEEK IMMEDIATE MEDICAL CARE IF:  You pass out.  Document Released: 05/14/2005 Document Revised: 01/14/2013 Document Reviewed: 10/30/2012 [facility] CenterExit[facility] Patient Information 2014 [place] HarborExitCare, [place]LL[place]. Try anaprox Use condoms Return in 1 week for [place]

## 2013-06-24 LAB — GC/CHLAMYDIA PROBE AMP
CT Probe RNA: NEGATIVE
GC Probe RNA: NEGATIVE

## 2013-06-30 ENCOUNTER — Ambulatory Visit: Payer: Medicaid Other | Admitting: Adult Health

## 2013-06-30 ENCOUNTER — Other Ambulatory Visit: Payer: Medicaid Other

## 2013-07-09 ENCOUNTER — Ambulatory Visit: Payer: Medicaid Other | Admitting: Adult Health

## 2013-07-09 ENCOUNTER — Other Ambulatory Visit: Payer: Medicaid Other

## 2013-07-12 ENCOUNTER — Emergency Department (HOSPITAL_COMMUNITY)
Admission: EM | Admit: 2013-07-12 | Discharge: 2013-07-12 | Disposition: A | Discharge status: Home / Self Care | Payer: Medicaid Other | Attending: Emergency Medicine | Admitting: Emergency Medicine

## 2013-07-12 ENCOUNTER — Encounter (HOSPITAL_COMMUNITY): Payer: Self-pay | Admitting: Emergency Medicine

## 2013-07-12 DIAGNOSIS — Z8679 Personal history of other diseases of the circulatory system: Secondary | ICD-10-CM | POA: Insufficient documentation

## 2013-07-12 DIAGNOSIS — Z8719 Personal history of other diseases of the digestive system: Secondary | ICD-10-CM | POA: Insufficient documentation

## 2013-07-12 DIAGNOSIS — Z8619 Personal history of other infectious and parasitic diseases: Secondary | ICD-10-CM | POA: Insufficient documentation

## 2013-07-12 DIAGNOSIS — J069 Acute upper respiratory infection, unspecified: Secondary | ICD-10-CM | POA: Insufficient documentation

## 2013-07-12 DIAGNOSIS — F17200 Nicotine dependence, unspecified, uncomplicated: Secondary | ICD-10-CM | POA: Insufficient documentation

## 2013-07-12 DIAGNOSIS — J45901 Unspecified asthma with (acute) exacerbation: Secondary | ICD-10-CM | POA: Insufficient documentation

## 2013-07-12 DIAGNOSIS — Z8742 Personal history of other diseases of the female genital tract: Secondary | ICD-10-CM | POA: Insufficient documentation

## 2013-07-12 DIAGNOSIS — J45909 Unspecified asthma, uncomplicated: Secondary | ICD-10-CM

## 2013-07-12 DIAGNOSIS — Z79899 Other long term (current) drug therapy: Secondary | ICD-10-CM | POA: Insufficient documentation

## 2013-07-12 DIAGNOSIS — Z8659 Personal history of other mental and behavioral disorders: Secondary | ICD-10-CM | POA: Insufficient documentation

## 2013-07-12 MED ORDER — ALBUTEROL SULFATE (2.5 MG/3ML) 0.083% IN NEBU
5.0000 mg | INHALATION_SOLUTION | Freq: Once | RESPIRATORY_TRACT | Status: AC
  Administered 2013-07-12: 5 mg via RESPIRATORY_TRACT
  Filled 2013-07-12: qty 6

## 2013-07-12 MED ORDER — ALBUTEROL SULFATE HFA 108 (90 BASE) MCG/ACT IN AERS: 2.0000 | INHALATION_SPRAY | RESPIRATORY_TRACT | Status: AC | PRN

## 2013-07-12 MED ORDER — PREDNISONE 20 MG PO TABS: 40.0000 mg | ORAL_TABLET | Freq: Every day | ORAL | Status: DC

## 2013-07-12 MED ORDER — PREDNISONE 50 MG PO TABS
60.0000 mg | ORAL_TABLET | Freq: Once | ORAL | Status: AC
  Administered 2013-07-12: 60 mg via ORAL
  Filled 2013-07-12 (×2): qty 1

## 2013-07-12 MED ORDER — IPRATROPIUM BROMIDE 0.02 % IN SOLN
0.5000 mg | Freq: Once | RESPIRATORY_TRACT | Status: AC
  Administered 2013-07-12: 0.5 mg via RESPIRATORY_TRACT
  Filled 2013-07-12: qty 2.5

## 2013-07-12 NOTE — Discharge Instructions (Signed)
Asthma, Adult Asthma is a recurring condition in which the airways tighten and narrow. Asthma can make it difficult to breathe. It can cause coughing, wheezing, and shortness of breath. Asthma episodes (also called asthma attacks) range from minor to life-threatening. Asthma cannot be cured, but medicines and lifestyle changes can help control it. CAUSES Asthma is believed to be caused by inherited (genetic) and environmental factors, but its exact cause is unknown. Asthma may be triggered by allergens, lung infections, or irritants in the air. Asthma triggers are different for each person. Common triggers include:   Animal dander.  Dust mites.  Cockroaches.  Pollen from trees or grass.  Mold.  Smoke.  Air pollutants such as dust, household cleaners, hair sprays, aerosol sprays, paint fumes, strong chemicals, or strong odors.  Cold air, weather changes, and winds (which increase molds and pollens in the air).  Strong emotional expressions such as crying or laughing hard.  Stress.  Certain medicines (such as aspirin) or types of drugs (such as beta-blockers).  Sulfites in foods and drinks. Foods and drinks that may contain sulfites include dried fruit, potato chips, and sparkling grape juice.  Infections or inflammatory conditions such as the flu, a cold, or an inflammation of the nasal membranes (rhinitis).  Gastroesophageal reflux disease (GERD).  Exercise or strenuous activity. SYMPTOMS Symptoms may occur immediately after asthma is triggered or many hours later. Symptoms include:  Wheezing.  Excessive nighttime or early morning coughing.  Frequent or severe coughing with a common cold.  Chest tightness.  Shortness of breath. DIAGNOSIS  The diagnosis of asthma is made by a review of your medical history and a physical exam. Tests may also be performed. These may include:  Lung function studies. These tests show how much air you breath in and out.  Allergy  tests.  Imaging tests such as X-rays. TREATMENT  Asthma cannot be cured, but it can usually be controlled. Treatment involves identifying and avoiding your asthma triggers. It also involves medicines. There are 2 classes of medicine used for asthma treatment:   Controller medicines. These prevent asthma symptoms from occurring. They are usually taken every day.  Reliever or rescue medicines. These quickly relieve asthma symptoms. They are used as needed and provide short-term relief. Your health care provider will help you create an asthma action plan. An asthma action plan is a written plan for managing and treating your asthma attacks. It includes a list of your asthma triggers and how they may be avoided. It also includes information on when medicines should be taken and when their dosage should be changed. An action plan may also involve the use of a device called a peak flow meter. A peak flow meter measures how well the lungs are working. It helps you monitor your condition. HOME CARE INSTRUCTIONS   Take medicine as directed by your health care provider. Speak with your health care provider if you have questions about how or when to take the medicines.  Use a peak flow meter as directed by your health care provider. Record and keep track of readings.  Understand and use the action plan to help minimize or stop an asthma attack without needing to seek medical care.  Control your home environment in the following ways to help prevent asthma attacks:  Do not smoke. Avoid being exposed to secondhand smoke.  Change your heating and air conditioning filter regularly.  Limit your use of fireplaces and wood stoves.  Get rid of pests (such as roaches and   mice) and their droppings.  Throw away plants if you see mold on them.  Clean your floors and dust regularly. Use unscented cleaning products.  Try to have someone else vacuum for you regularly. Stay out of rooms while they are being  vacuumed and for a short while afterward. If you vacuum, use a dust mask from a hardware store, a double-layered or microfilter vacuum cleaner bag, or a vacuum cleaner with a HEPA filter.  Replace carpet with wood, tile, or vinyl flooring. Carpet can trap dander and dust.  Use allergy-proof pillows, mattress covers, and box spring covers.  Wash bed sheets and blankets every week in hot water and dry them in a dryer.  Use blankets that are made of polyester or cotton.  Clean bathrooms and kitchens with bleach. If possible, have someone repaint the walls in these rooms with mold-resistant paint. Keep out of the rooms that are being cleaned and painted.  Wash hands frequently. SEEK MEDICAL CARE IF:   You have wheezing, shortness of breath, or a cough even if taking medicine to prevent attacks.  The colored mucus you cough up (sputum) is thicker than usual.  Your sputum changes from clear or white to yellow, green, gray, or bloody.  You have any problems that may be related to the medicines you are taking (such as a rash, itching, swelling, or trouble breathing).  You are using a reliever medicine more than 2 3 times per week.  Your peak flow is still at 50 79% of you personal best after following your action plan for 1 hour. SEEK IMMEDIATE MEDICAL CARE IF:   You seem to be getting worse and are unresponsive to treatment during an asthma attack.  You are short of breath even at rest.  You get short of breath when doing very little physical activity.  You have difficulty eating, drinking, or talking due to asthma symptoms.  You develop chest pain.  You develop a fast heartbeat.  You have a bluish color to your lips or fingernails.  You are lightheaded, dizzy, or faint.  Your peak flow is less than 50% of your personal best.  You have a fever or persistent symptoms for more than 2 3 days.  You have a fever and symptoms suddenly get worse. MAKE SURE YOU:   Understand these  instructions.  Will watch your condition.  Will get help right away if you are not doing well or get worse. Document Released: 05/14/2005 Document Revised: 01/14/2013 Document Reviewed: 12/11/2012 [employer] Patient Information 2014 [employer]. Upper Respiratory Infection, Adult An upper respiratory infection (URI) is also sometimes known as the common cold. The upper respiratory tract includes the nose, sinuses, throat, trachea, and bronchi. Bronchi are the airways leading to the lungs. Most people improve within 1 week, but symptoms can last up to 2 weeks. A residual cough may last even longer.  CAUSES Many different viruses can infect the tissues lining the upper respiratory tract. The tissues become irritated and inflamed and often become very moist. Mucus production is also common. A cold is contagious. You can easily spread the virus to others by oral contact. This includes kissing, sharing a glass, coughing, or sneezing. Touching your mouth or nose and then touching a surface, which is then touched by another person, can also spread the virus. SYMPTOMS  Symptoms typically develop 1 to 3 days after you come in contact with a cold virus. Symptoms vary from person to person. They may include:  Runny nose.    Sneezing.  Nasal congestion.  Sinus irritation.  Sore throat.  Loss of voice (laryngitis).  Cough.  Fatigue.  Muscle aches.  Loss of appetite.  Headache.  Low-grade fever. DIAGNOSIS  You might diagnose your own cold based on familiar symptoms, since most people get a cold 2 to 3 times a year. Your caregiver can confirm this based on your exam. Most importantly, your caregiver can check that your symptoms are not due to another disease such as strep throat, sinusitis, pneumonia, asthma, or epiglottitis. Blood tests, throat tests, and X-rays are not necessary to diagnose a common cold, but they may sometimes be helpful in excluding other more serious diseases. Your  caregiver will decide if any further tests are required. RISKS AND COMPLICATIONS  You may be at risk for a more severe case of the common cold if you smoke cigarettes, have chronic heart disease (such as heart failure) or lung disease (such as asthma), or if you have a weakened immune system. The very young and very old are also at risk for more serious infections. Bacterial sinusitis, middle ear infections, and bacterial pneumonia can complicate the common cold. The common cold can worsen asthma and chronic obstructive pulmonary disease (COPD). Sometimes, these complications can require emergency medical care and may be life-threatening. PREVENTION  The best way to protect against getting a cold is to practice good hygiene. Avoid oral or hand contact with people with cold symptoms. Wash your hands often if contact occurs. There is no clear evidence that vitamin C, vitamin E, echinacea, or exercise reduces the chance of developing a cold. However, it is always recommended to get plenty of rest and practice good nutrition. TREATMENT  Treatment is directed at relieving symptoms. There is no cure. Antibiotics are not effective, because the infection is caused by a virus, not by bacteria. Treatment may include:  Increased fluid intake. Sports drinks offer valuable electrolytes, sugars, and fluids.  Breathing heated mist or steam (vaporizer or shower).  Eating chicken soup or other clear broths, and maintaining good nutrition.  Getting plenty of rest.  Using gargles or lozenges for comfort.  Controlling fevers with ibuprofen or acetaminophen as directed by your caregiver.  Increasing usage of your inhaler if you have asthma. Zinc gel and zinc lozenges, taken in the first 24 hours of the common cold, can shorten the duration and lessen the severity of symptoms. Pain medicines may help with fever, muscle aches, and throat pain. A variety of non-prescription medicines are available to treat congestion  and runny nose. Your caregiver can make recommendations and may suggest nasal or lung inhalers for other symptoms.  HOME CARE INSTRUCTIONS   Only take over-the-counter or prescription medicines for pain, discomfort, or fever as directed by your caregiver.  Use a warm mist humidifier or inhale steam from a shower to increase air moisture. This may keep secretions moist and make it easier to breathe.  Drink enough water and fluids to keep your urine clear or pale yellow.  Rest as needed.  Return to work when your temperature has returned to normal or as your caregiver advises. You may need to stay home longer to avoid infecting others. You can also use a face mask and careful hand washing to prevent spread of the virus. SEEK MEDICAL CARE IF:   After the first few days, you feel you are getting worse rather than better.  You need your caregiver's advice about medicines to control symptoms.  You develop chills, worsening shortness of breath, or   brown or red sputum. These may be signs of pneumonia.  You develop yellow or brown nasal discharge or pain in the face, especially when you bend forward. These may be signs of sinusitis.  You develop a fever, swollen neck glands, pain with swallowing, or white areas in the back of your throat. These may be signs of strep throat. SEEK IMMEDIATE MEDICAL CARE IF:   You have a fever.  You develop severe or persistent headache, ear pain, sinus pain, or chest pain.  You develop wheezing, a prolonged cough, cough up blood, or have a change in your usual mucus (if you have chronic lung disease).  You develop sore muscles or a stiff neck. Document Released: 11/07/2000 Document Revised: 08/06/2011 Document Reviewed: 09/15/2010 [employer] Patient Information 2014 [employer].  

## 2013-07-12 NOTE — ED Provider Notes (Signed)
CSN: [id number]     Arrival date & time 07/12/13  1953 History   First MD Initiated Contact with Patient 07/12/13 2026     Chief Complaint  Patient presents with  . Cough  . Sore Throat  . Nasal Congestion  . Shortness of Breath     (Consider location/radiation/quality/duration/timing/severity/associated sxs/prior Treatment) HPI Comments: Patient presents to the ER with upper respiratory infection symptoms. She reports that the symptoms began several days ago. She has been experiencing cough, sore throat, swollen glands, nasal congestion. She reports that she has been wheezing in her symptoms have not improved with using her albuterol inhaler. There has not been any fever.  Patient is a 23 y.o. female presenting with cough, pharyngitis, and shortness of breath.  Cough Associated symptoms: shortness of breath and sore throat   Sore Throat Associated symptoms include shortness of breath.  Shortness of Breath Associated symptoms: cough and sore throat     Past Medical History  Diagnosis Date  . Asthma   . Chlamydia   . Depression   . Migraine   . Back pain   . IBS (irritable bowel syndrome)   . Trichomonal vaginitis 06/04/2013  . Lymph nodes enlarged 06/04/2013    Bilateral in groin L>R +trich  . Dysmenorrhea 06/04/2013   Past Surgical History  Procedure Laterality Date  . Tonsillectomy     History reviewed. No pertinent family history. History  Substance Use Topics  . Smoking status: Current Some Day Smoker -- 0.50 packs/day    Types: Cigarettes  . Smokeless tobacco: Never Used  . Alcohol Use: No   OB History   Grav Para Term Preterm Abortions TAB SAB Ect Mult Living   3    3  3         Review of Systems  HENT: Positive for congestion, sore throat and voice change.   Respiratory: Positive for cough and shortness of breath.   All other systems reviewed and are negative.      Allergies  Raspberry and Other  Home Medications   Current Outpatient Rx  Name   Route  Sig  Dispense  Refill  . acetaminophen (TYLENOL) 500 MG tablet   Oral   Take 500 mg by mouth every 6 (six) hours as needed for pain.         Marland Kitchen albuterol (PROVENTIL HFA;VENTOLIN HFA) 108 (90 BASE) MCG/ACT inhaler   Inhalation   Inhale 2 puffs into the lungs every 4 (four) hours as needed for wheezing.   1 Inhaler   0    BP 115/62  Pulse 92  Temp(Src) 98.9 F (37.2 C) (Oral)  Resp 24  Ht 5' (1.524 m)  Wt 119 lb 8 oz (54.205 kg)  BMI 23.34 kg/m2  SpO2 97%  LMP 07/08/2013 Physical Exam  Constitutional: She is oriented to person, place, and time. She appears well-developed and well-nourished. No distress.  HENT:  Head: Normocephalic and atraumatic.  Right Ear: Hearing normal.  Left Ear: Hearing normal.  Nose: Nose normal.  Mouth/Throat: Oropharynx is clear and moist and mucous membranes are normal.  Eyes: Conjunctivae and EOM are normal. Pupils are equal, round, and reactive to light.  Neck: Normal range of motion. Neck supple.  Cardiovascular: Regular rhythm, S1 normal and S2 normal.  Exam reveals no gallop and no friction rub.   No murmur heard. Pulmonary/Chest: Effort normal and breath sounds normal. No respiratory distress. She exhibits no tenderness.  Abdominal: Soft. Normal appearance and bowel sounds are normal. There  is no hepatosplenomegaly. There is no tenderness. There is no rebound, no guarding, no tenderness at McBurney's point and negative Murphy's sign. No hernia.  Musculoskeletal: Normal range of motion.  Neurological: She is alert and oriented to person, place, and time. She has normal strength. No cranial nerve deficit or sensory deficit. Coordination normal. GCS eye subscore is 4. GCS verbal subscore is 5. GCS motor subscore is 6.  Skin: Skin is warm, dry and intact. No rash noted. No cyanosis.  Psychiatric: She has a normal mood and affect. Her speech is normal and behavior is normal. Thought content normal.    ED Course  Procedures (including  critical care time) Labs Review Labs Reviewed - No data to display Imaging Review No results found.  EKG Interpretation   None       MDM   Final diagnoses:  Upper respiratory infection    Patient presents to the ER with a several day history of upper respiratory infection symptoms with complaints of worsening asthma. At arrival she has normal oxygenation. Lungs are clear, no wheezing currently. Also no clinical concern for pneumonia. The remainder of the examination was unremarkable. Patient reports that she feels like she is tight with her breathing, was therefore administered prednisone and a breathing treatment. Will be continued on albuterol and prednisone, systematic treatment as an outpatient.    Gilda Creasehristopher J. Kortney Potvin, MD 07/12/13 2104

## 2013-07-12 NOTE — ED Notes (Addendum)
Pt c/o cough, congestion, headache, wheezing, sore throat, fever?? (pt says her boyfriend says she felt hot) and some sob. Inhaler is not helping.

## 2013-07-21 ENCOUNTER — Ambulatory Visit: Payer: Medicaid Other | Admitting: Adult Health

## 2013-07-21 ENCOUNTER — Other Ambulatory Visit: Payer: Medicaid Other

## 2013-08-13 ENCOUNTER — Encounter: Payer: Self-pay | Admitting: *Deleted

## 2013-08-13 ENCOUNTER — Ambulatory Visit: Payer: Medicaid Other | Admitting: Adult Health

## 2013-08-13 ENCOUNTER — Other Ambulatory Visit: Payer: Medicaid Other

## 2013-08-21 ENCOUNTER — Telehealth: Payer: Self-pay

## 2013-08-22 ENCOUNTER — Encounter (HOSPITAL_COMMUNITY): Payer: Self-pay | Admitting: Emergency Medicine

## 2013-08-22 ENCOUNTER — Emergency Department (HOSPITAL_COMMUNITY)
Admission: EM | Admit: 2013-08-22 | Discharge: 2013-08-22 | Disposition: A | Discharge status: Home / Self Care | Payer: Medicaid Other | Attending: Emergency Medicine | Admitting: Emergency Medicine

## 2013-08-22 DIAGNOSIS — Z3202 Encounter for pregnancy test, result negative: Secondary | ICD-10-CM | POA: Insufficient documentation

## 2013-08-22 DIAGNOSIS — N750 Cyst of Bartholin's gland: Secondary | ICD-10-CM | POA: Insufficient documentation

## 2013-08-22 DIAGNOSIS — N390 Urinary tract infection, site not specified: Secondary | ICD-10-CM | POA: Insufficient documentation

## 2013-08-22 DIAGNOSIS — A499 Bacterial infection, unspecified: Secondary | ICD-10-CM | POA: Insufficient documentation

## 2013-08-22 DIAGNOSIS — B9689 Other specified bacterial agents as the cause of diseases classified elsewhere: Secondary | ICD-10-CM | POA: Insufficient documentation

## 2013-08-22 DIAGNOSIS — J45909 Unspecified asthma, uncomplicated: Secondary | ICD-10-CM | POA: Insufficient documentation

## 2013-08-22 DIAGNOSIS — N72 Inflammatory disease of cervix uteri: Secondary | ICD-10-CM | POA: Insufficient documentation

## 2013-08-22 DIAGNOSIS — Z8659 Personal history of other mental and behavioral disorders: Secondary | ICD-10-CM | POA: Insufficient documentation

## 2013-08-22 DIAGNOSIS — Z8719 Personal history of other diseases of the digestive system: Secondary | ICD-10-CM | POA: Insufficient documentation

## 2013-08-22 DIAGNOSIS — Z79899 Other long term (current) drug therapy: Secondary | ICD-10-CM | POA: Insufficient documentation

## 2013-08-22 DIAGNOSIS — F17200 Nicotine dependence, unspecified, uncomplicated: Secondary | ICD-10-CM | POA: Insufficient documentation

## 2013-08-22 DIAGNOSIS — N760 Acute vaginitis: Secondary | ICD-10-CM | POA: Insufficient documentation

## 2013-08-22 DIAGNOSIS — Z8679 Personal history of other diseases of the circulatory system: Secondary | ICD-10-CM | POA: Insufficient documentation

## 2013-08-22 LAB — URINALYSIS, ROUTINE W REFLEX MICROSCOPIC
BILIRUBIN URINE: NEGATIVE
Glucose, UA: NEGATIVE mg/dL
Ketones, ur: NEGATIVE mg/dL
NITRITE: POSITIVE — AB
Protein, ur: 30 mg/dL — AB
SPECIFIC GRAVITY, URINE: 1.02 (ref 1.005–1.030)
UROBILINOGEN UA: 0.2 mg/dL (ref 0.0–1.0)
pH: 6 (ref 5.0–8.0)

## 2013-08-22 LAB — WET PREP, GENITAL
Trich, Wet Prep: NONE SEEN
Yeast Wet Prep HPF POC: NONE SEEN

## 2013-08-22 LAB — URINE MICROSCOPIC-ADD ON

## 2013-08-22 LAB — PREGNANCY, URINE: PREG TEST UR: NEGATIVE

## 2013-08-22 MED ORDER — LIDOCAINE HCL (PF) 1 % IJ SOLN
INTRAMUSCULAR | Status: AC
  Filled 2013-08-22: qty 5

## 2013-08-22 MED ORDER — LIDOCAINE HCL (PF) 1 % IJ SOLN
INTRAMUSCULAR | Status: AC
  Administered 2013-08-22: 5 mL
  Filled 2013-08-22: qty 5

## 2013-08-22 MED ORDER — PROMETHAZINE HCL 25 MG PO TABS: 25.0000 mg | ORAL_TABLET | Freq: Four times a day (QID) | ORAL | Status: DC | PRN

## 2013-08-22 MED ORDER — CEFTRIAXONE SODIUM 250 MG IJ SOLR
250.0000 mg | Freq: Once | INTRAMUSCULAR | Status: AC
  Administered 2013-08-22: 250 mg via INTRAMUSCULAR
  Filled 2013-08-22: qty 250

## 2013-08-22 MED ORDER — DOXYCYCLINE HYCLATE 100 MG PO CAPS: 100.0000 mg | ORAL_CAPSULE | Freq: Two times a day (BID) | ORAL | Status: DC

## 2013-08-22 MED ORDER — METRONIDAZOLE 500 MG PO TABS: 500.0000 mg | ORAL_TABLET | Freq: Two times a day (BID) | ORAL | Status: DC

## 2013-08-22 MED ORDER — HYDROCODONE-ACETAMINOPHEN 5-325 MG PO TABS: 2.0000 | ORAL_TABLET | ORAL | Status: DC | PRN

## 2013-08-22 MED ORDER — AZITHROMYCIN 1 G PO PACK
1.0000 g | PACK | Freq: Once | ORAL | Status: AC
  Administered 2013-08-22: 1 g via ORAL
  Filled 2013-08-22: qty 1

## 2013-08-22 NOTE — ED Notes (Signed)
Pt. Discharged with rx for pain and nausea medication.

## 2013-08-22 NOTE — Discharge Instructions (Signed)
You have a cervical infection called cervicitis.  Additionally you have a urinary tract infection and an infection called bacterial vaginosis. Prescriptions for 2 antibiotics, pain medication, nausea medication. You must use condoms. Your boyfriend needs to be checked also.  Followup Dr. Emelda FearFerguson

## 2013-08-22 NOTE — ED Provider Notes (Signed)
CSN: [id number]     Arrival date & time 08/22/13  1558 History  This chart was scribed for Donnetta HutchingBrian Daric Koren, MD by Dorothey Basemania Sutton, ED Scribe. This patient was seen in room APA03/APA03 and the patient's care was started at 4:37 PM.    Chief Complaint  Patient presents with  . Abdominal Pain   The history is provided by the patient. No language interpreter was used.   HPI Comments: Mary [name] is a 23 y.o. female with a history of chlamydia, trichomonal vaginitis, and dysmenorrhea, patient is [phone] who presents to the Emergency Department complaining of stabbing dysuria with associated vaginal pain onset 3 days ago. She states that she called her gynecologist yesterday for similar complaints, but that she would not be able to make an appointment until next week. She denies vaginal bleeding or discharge. Patient reports her LMP was in January, 2015, but that her periods are usually irregular. She reports that she has had adverse effects with IUD birth control methods, so she does not currently use birth control. Patient reports that she was last sexually active about a week ago and did not use protection. Patient also has a history of asthma and IBS.   Past Medical History  Diagnosis Date  . Asthma   . Chlamydia   . Depression   . Migraine   . Back pain   . IBS (irritable bowel syndrome)   . Trichomonal vaginitis 06/04/2013  . Lymph nodes enlarged 06/04/2013    Bilateral in groin L>R +trich  . Dysmenorrhea 06/04/2013   Past Surgical History  Procedure Laterality Date  . Tonsillectomy     No family history on file. History  Substance Use Topics  . Smoking status: Current Some Day Smoker -- 0.50 packs/day    Types: Cigarettes  . Smokeless tobacco: Never Used  . Alcohol Use: No   OB History   Grav Para Term Preterm Abortions TAB SAB Ect Mult Living   3    3  3         Review of Systems  A complete 10 system review of systems was obtained and all systems are negative except as noted in the  HPI and PMH.    Allergies  Raspberry and Other  Home Medications   Current Outpatient Rx  Name  Route  Sig  Dispense  Refill  . acetaminophen (TYLENOL) 500 MG tablet   Oral   Take 500 mg by mouth every 6 (six) hours as needed for pain.         Marland Kitchen. albuterol (PROVENTIL HFA;VENTOLIN HFA) 108 (90 BASE) MCG/ACT inhaler   Inhalation   Inhale 2 puffs into the lungs every 4 (four) hours as needed for wheezing or shortness of breath.   1 Inhaler   2   . doxycycline (VIBRAMYCIN) 100 MG capsule   Oral   Take 1 capsule (100 mg total) by mouth 2 (two) times daily. One po bid x 7 days   14 capsule   0   . HYDROcodone-acetaminophen (NORCO) 5-325 MG per tablet   Oral   Take 2 tablets by mouth every 4 (four) hours as needed.   20 tablet   0   . metroNIDAZOLE (FLAGYL) 500 MG tablet   Oral   Take 1 tablet (500 mg total) by mouth 2 (two) times daily. One po bid x 7 days   14 tablet   0   . promethazine (PHENERGAN) 25 MG tablet   Oral   Take 1 tablet (  25 mg total) by mouth every 6 (six) hours as needed.   15 tablet   0    Triage Vitals: BP 132/65  Pulse 105  Temp(Src) 99.1 F (37.3 C) (Oral)  Resp 20  Ht 5' (1.524 m)  Wt 115 lb 6 oz (52.334 kg)  BMI 22.53 kg/m2  SpO2 99%  Physical Exam  Nursing note and vitals reviewed. Constitutional: She is oriented to person, place, and time. She appears well-developed and well-nourished.  HENT:  Head: Normocephalic and atraumatic.  Eyes: Conjunctivae and EOM are normal. Pupils are equal, round, and reactive to light.  Neck: Normal range of motion. Neck supple.  Cardiovascular: Normal rate, regular rhythm and normal heart sounds.   Pulmonary/Chest: Effort normal and breath sounds normal.  Abdominal: Soft. Bowel sounds are normal. There is tenderness.  Mild tenderness to the lower abdomen.   Genitourinary:  External genitalia: 0.5 x 1.5 cm Bartholin cyst on right labia majora superior aspect.   Positive cervical motion tenderness.  White cervical discharge. No adnexal tenderness.  Musculoskeletal: Normal range of motion.  Neurological: She is alert and oriented to person, place, and time.  Skin: Skin is warm and dry.  Psychiatric: She has a normal mood and affect. Her behavior is normal.    ED Course  Procedures (including critical care time)  DIAGNOSTIC STUDIES: Oxygen Saturation is 99% on room air, normal by my interpretation.    COORDINATION OF CARE: 4:42 PM- Will order labs and UA. Will perform a pelvic exam. Discussed treatment plan with patient at bedside and patient verbalized agreement.   Results for orders placed during the hospital encounter of 08/22/13  WET PREP, GENITAL      Result Value Ref Range   Yeast Wet Prep HPF POC NONE SEEN  NONE SEEN   Trich, Wet Prep NONE SEEN  NONE SEEN   Clue Cells Wet Prep HPF POC MODERATE (*) NONE SEEN   WBC, Wet Prep HPF POC MODERATE (*) NONE SEEN  URINALYSIS, ROUTINE W REFLEX MICROSCOPIC      Result Value Ref Range   Color, Urine YELLOW  YELLOW   APPearance CLOUDY (*) CLEAR   Specific Gravity, Urine 1.020  1.005 - 1.030   pH 6.0  5.0 - 8.0   Glucose, UA NEGATIVE  NEGATIVE mg/dL   Hgb urine dipstick MODERATE (*) NEGATIVE   Bilirubin Urine NEGATIVE  NEGATIVE   Ketones, ur NEGATIVE  NEGATIVE mg/dL   Protein, ur 30 (*) NEGATIVE mg/dL   Urobilinogen, UA 0.2  0.0 - 1.0 mg/dL   Nitrite POSITIVE (*) NEGATIVE   Leukocytes, UA MODERATE (*) NEGATIVE  PREGNANCY, URINE      Result Value Ref Range   Preg Test, Ur NEGATIVE  NEGATIVE  URINE MICROSCOPIC-ADD ON      Result Value Ref Range   WBC, UA TOO NUMEROUS TO COUNT  <3 WBC/hpf   RBC / HPF 11-20  <3 RBC/hpf   Bacteria, UA MANY (*) RARE    EKG Interpretation None      MDM   Final diagnoses:  Cervicitis  Bacterial vaginosis  Urinary tract infection    Patient has abnormal pelvic exam. Clinical evidence of Bartholin's cyst and cervicitis. Wet prep positive for clue cells. Urinalysis positive for infection.  Rx Rocephin 250 mg IM and Zithromax 1 g by mouth in the emergency department. Discharge medications Flagyl 500 mg twice a day for one week, doxycycline 100 mg twice a day for one week, Norco, Phenergan 25 mg. Patient has  gynecological followup. She was counseled to use condoms and have her boyfriend checked.  I personally performed the services described in this documentation, which was scribed in my presence. The recorded information has been reviewed and is accurate.      [name], MD 08/22/13 [phone]

## 2013-08-22 NOTE — ED Notes (Addendum)
Pt c/o abd pain and vaginal started Thursday. States that pain started with urination and upon completing urination, "feels like my insides are coming out." States that vaginal pain alleviated with pressure applied to external genitalia. States she had normal discharge before pain began. Hx of miscarriages. Sexually active, no BC.

## 2013-08-22 NOTE — ED Notes (Signed)
Pt c/o pain with urination, lower abd pain, vaginal pressure,  tiredness, for the past two days,

## 2013-08-24 LAB — GC/CHLAMYDIA PROBE AMP
CT PROBE, AMP APTIMA: NEGATIVE
GC PROBE AMP APTIMA: NEGATIVE

## 2013-08-25 ENCOUNTER — Ambulatory Visit (INDEPENDENT_AMBULATORY_CARE_PROVIDER_SITE_OTHER): Payer: Medicaid Other | Admitting: Women's Health

## 2013-08-25 VITALS — BP 112/72 | Ht 60.0 in | Wt 119.0 lb

## 2013-08-25 DIAGNOSIS — R300 Dysuria: Secondary | ICD-10-CM

## 2013-08-25 DIAGNOSIS — N86 Erosion and ectropion of cervix uteri: Secondary | ICD-10-CM

## 2013-08-25 DIAGNOSIS — N390 Urinary tract infection, site not specified: Secondary | ICD-10-CM

## 2013-08-25 DIAGNOSIS — R309 Painful micturition, unspecified: Secondary | ICD-10-CM

## 2013-08-25 DIAGNOSIS — N949 Unspecified condition associated with female genital organs and menstrual cycle: Secondary | ICD-10-CM

## 2013-08-25 DIAGNOSIS — R102 Pelvic and perineal pain: Secondary | ICD-10-CM

## 2013-08-25 LAB — POCT URINALYSIS DIPSTICK
Glucose, UA: NEGATIVE
Ketones, UA: NEGATIVE
LEUKOCYTES UA: NEGATIVE
NITRITE UA: NEGATIVE
PROTEIN UA: 3

## 2013-08-25 MED ORDER — OXYCODONE-ACETAMINOPHEN 5-325 MG PO TABS: 1.0000 | ORAL_TABLET | ORAL | Status: DC | PRN

## 2013-08-25 MED ORDER — VALACYCLOVIR HCL 1 G PO TABS: 1000.0000 mg | ORAL_TABLET | Freq: Two times a day (BID) | ORAL | Status: DC

## 2013-08-25 NOTE — Patient Instructions (Signed)
Genital Herpes  Genital herpes is a sexually transmitted disease. This means that it is a disease passed by having sex with an infected person. There is no cure for genital herpes. The time between attacks can be months to years. The virus may live in a person but produce no problems (symptoms). This infection can be passed to a baby as it travels down the birth canal (vagina). In a newborn, this can cause central nervous system damage, eye damage, or even death. The virus that causes genital herpes is usually HSV-2 virus. The virus that causes oral herpes is usually HSV-1. The diagnosis (learning what is wrong) is made through culture results.  SYMPTOMS   Usually symptoms of pain and itching begin a few days to a week after contact. It first appears as small blisters that progress to small painful ulcers which then scab over and heal after several days. It affects the outer genitalia, birth canal, cervix, penis, anal area, buttocks, and thighs.  HOME CARE INSTRUCTIONS   · Keep ulcerated areas dry and clean.  · Take medications as directed. Antiviral medications can speed up healing. They will not prevent recurrences or cure this infection. These medications can also be taken for suppression if there are frequent recurrences.  · While the infection is active, it is contagious. Avoid all sexual contact during active infections.  · Condoms may help prevent spread of the herpes virus.  · Practice safe sex.  · Wash your hands thoroughly after touching the genital area.  · Avoid touching your eyes after touching your genital area.  · Inform your caregiver if you have had genital herpes and become pregnant. It is your responsibility to insure a safe outcome for your baby in this pregnancy.  · Only take over-the-counter or prescription medicines for pain, discomfort, or fever as directed by your caregiver.  SEEK MEDICAL CARE IF:   · You have a recurrence of this infection.  · You do not respond to medications and are not  improving.  · You have new sources of pain or discharge which have changed from the original infection.  · You have an oral temperature above 102° F (38.9° C).  · You develop abdominal pain.  · You develop eye pain or signs of eye infection.  Document Released: 05/11/2000 Document Revised: 08/06/2011 Document Reviewed: 06/01/2009  ExitCare® Patient Information ©2014 [employer] LLC.

## 2013-08-25 NOTE — Progress Notes (Signed)
Patient ID: Mary [name], female   DOB: [birth year], 23 y.o.   MRN: [id number]   [name]  Patient name: Mary GalloMargret N [name] MRN [id number]  Date of birth: [birth year]  CC & HPI:  Mary [name] is a 23 y.o. [id number] Caucasian female presenting today for severe pelvic/vaginal pain x ~1wk. [facility] dx w/ UTI, BV, cervicitis, small Rt bartholin's cyst, preg test neg, GC/CT neg. Tx w/ rocephin 250mg  IM, Zithromax 1g po, rx for flagyl 500mg  bid x 7d, doxycycline 100mg  BID x 1wk which her pharmacy couldn't fill b/c they were out of it, norco, and phenergan. Reports pain as constant, worse w/ urination or sitting. No n/v. + subjective fever/chills- hasn't taken temp. She states the norco isn't touching the pain. H/O CT and trichomonas 2months ago. Currently using condoms for contraception, states they have broken multiple times, but does not wish to begin any other contraception at this time. States boyfriend has HSV2, and they have 'tried to be very careful' so that she doesn't get it. Has never had pap.   Pertinent History Reviewed:  Medical & Surgical Hx:   Past Medical History  Diagnosis Date  . Asthma   . Chlamydia   . Depression   . Migraine   . Back pain   . IBS (irritable bowel syndrome)   . Trichomonal vaginitis 06/04/2013  . Lymph nodes enlarged 06/04/2013    Bilateral in groin L>R +trich  . Dysmenorrhea 06/04/2013   Past Surgical History  Procedure Laterality Date  . Tonsillectomy     Medications: Reviewed & Updated - see associated section Social History: Reviewed -  reports that she has been smoking Cigarettes.  She has been smoking about 0.50 packs per day. She has never used smokeless tobacco.  Objective Findings:  Vitals: BP 112/72  Ht 5' (1.524 m)  Wt 119 lb (53.978 kg)  BMI 23.24 kg/m2 Temp 98.3  Physical Examination: General appearance - in mild to moderate distress Pelvic - Has small slightly tender nonfluctuent boil on mons, small ulcerated  lesion Rt upper labia majora, no Bartholin's cyst visualized/palpated. Pt very tender to internal exam. Spec exam: cx appears eroded, co-exam w/ Mary [name], cervical HSV cx obtained, +CMT, brownish/pinkish d/c.   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

## 2013-08-25 NOTE — Telephone Encounter (Signed)
Pt states was seen in ER on Saturday for right sided pain, not feeling any better. Pt states her MCD ends today. Pt to be here at 3:45 per front staff to see Joellyn HaffKim Booker, CNM.

## 2013-08-26 ENCOUNTER — Encounter: Payer: Self-pay | Admitting: Women's Health

## 2013-08-26 DIAGNOSIS — F1120 Opioid dependence, uncomplicated: Secondary | ICD-10-CM | POA: Insufficient documentation

## 2013-08-26 LAB — SEDIMENTATION RATE: SED RATE: 45 mm/h — AB (ref 0–22)

## 2013-08-27 LAB — HERPES SIMPLEX VIRUS CULTURE: Organism ID, Bacteria: DETECTED

## 2013-08-27 LAB — URINE CULTURE
Colony Count: NO GROWTH
Organism ID, Bacteria: NO GROWTH

## 2013-09-01 ENCOUNTER — Telehealth: Payer: Self-pay | Admitting: Women's Health

## 2013-09-01 NOTE — Telephone Encounter (Signed)
Notified pt cx came back +HSV2. States she's feeling much better, does not have insurance right now so doesn't want to do suppressive therapy. Will call if gets another outbreak.  Cheral MarkerKimberly R. Madline Oesterling, CNM, [facility] 09/01/2013 9:24 AM

## 2013-12-01 ENCOUNTER — Emergency Department (HOSPITAL_COMMUNITY)
Admission: EM | Admit: 2013-12-01 | Discharge: 2013-12-01 | Disposition: A | Discharge status: Home / Self Care | Payer: Medicaid Other | Attending: Emergency Medicine | Admitting: Emergency Medicine

## 2013-12-01 ENCOUNTER — Encounter (HOSPITAL_COMMUNITY): Payer: Self-pay | Admitting: Emergency Medicine

## 2013-12-01 DIAGNOSIS — K089 Disorder of teeth and supporting structures, unspecified: Secondary | ICD-10-CM | POA: Insufficient documentation

## 2013-12-01 DIAGNOSIS — Z8742 Personal history of other diseases of the female genital tract: Secondary | ICD-10-CM | POA: Insufficient documentation

## 2013-12-01 DIAGNOSIS — Z8719 Personal history of other diseases of the digestive system: Secondary | ICD-10-CM | POA: Insufficient documentation

## 2013-12-01 DIAGNOSIS — Z8619 Personal history of other infectious and parasitic diseases: Secondary | ICD-10-CM | POA: Insufficient documentation

## 2013-12-01 DIAGNOSIS — K029 Dental caries, unspecified: Secondary | ICD-10-CM | POA: Insufficient documentation

## 2013-12-01 DIAGNOSIS — F17200 Nicotine dependence, unspecified, uncomplicated: Secondary | ICD-10-CM | POA: Insufficient documentation

## 2013-12-01 DIAGNOSIS — K0889 Other specified disorders of teeth and supporting structures: Secondary | ICD-10-CM

## 2013-12-01 DIAGNOSIS — J45909 Unspecified asthma, uncomplicated: Secondary | ICD-10-CM | POA: Insufficient documentation

## 2013-12-01 DIAGNOSIS — Z8679 Personal history of other diseases of the circulatory system: Secondary | ICD-10-CM | POA: Insufficient documentation

## 2013-12-01 DIAGNOSIS — R51 Headache: Secondary | ICD-10-CM | POA: Insufficient documentation

## 2013-12-01 DIAGNOSIS — Z8659 Personal history of other mental and behavioral disorders: Secondary | ICD-10-CM | POA: Insufficient documentation

## 2013-12-01 MED ORDER — ACETAMINOPHEN-CODEINE #3 300-30 MG PO TABS: 1.0000 | ORAL_TABLET | Freq: Four times a day (QID) | ORAL | Status: DC | PRN

## 2013-12-01 MED ORDER — AMOXICILLIN 250 MG PO CAPS
500.0000 mg | ORAL_CAPSULE | Freq: Once | ORAL | Status: AC
  Administered 2013-12-01: 500 mg via ORAL
  Filled 2013-12-01: qty 2

## 2013-12-01 MED ORDER — IBUPROFEN 800 MG PO TABS: 800.0000 mg | ORAL_TABLET | Freq: Three times a day (TID) | ORAL | Status: DC

## 2013-12-01 MED ORDER — ONDANSETRON HCL 4 MG PO TABS
4.0000 mg | ORAL_TABLET | Freq: Once | ORAL | Status: AC
  Administered 2013-12-01: 4 mg via ORAL
  Filled 2013-12-01: qty 1

## 2013-12-01 MED ORDER — TRAMADOL HCL 50 MG PO TABS: ORAL_TABLET | ORAL | Status: DC

## 2013-12-01 MED ORDER — KETOROLAC TROMETHAMINE 10 MG PO TABS
10.0000 mg | ORAL_TABLET | Freq: Once | ORAL | Status: AC
  Administered 2013-12-01: 10 mg via ORAL
  Filled 2013-12-01: qty 1

## 2013-12-01 MED ORDER — AMOXICILLIN 500 MG PO CAPS: 500.0000 mg | ORAL_CAPSULE | Freq: Three times a day (TID) | ORAL | Status: DC

## 2013-12-01 NOTE — ED Notes (Signed)
Pt states she had to leave to pick up family members. Ivery QualeHobson Bryant, PA advised pt to stay for tx but pt refused.

## 2013-12-01 NOTE — ED Notes (Signed)
Pain lt side of jaw for 1 week

## 2013-12-01 NOTE — ED Provider Notes (Signed)
CSN: [id number]     Arrival date & time 12/01/13  1933 History   None    Chief Complaint  Patient presents with  . Dental Pain     (Consider location/radiation/quality/duration/timing/severity/associated sxs/prior Treatment) Patient is a 23 y.o. female presenting with tooth pain. The history is provided by the patient.  Dental Pain Location:  Lower Quality:  Aching and throbbing Severity:  Moderate Onset quality:  Gradual Duration:  1 week Timing:  Intermittent Progression:  Worsening Context: dental caries and poor dentition   Context: not trauma   Relieved by:  Nothing Worsened by:  Cold food/drink Ineffective treatments:  NSAIDs Associated symptoms: gum swelling and headaches   Associated symptoms: no fever, no neck pain and no neck swelling   Risk factors: smoking   Risk factors: no immunosuppression     Past Medical History  Diagnosis Date  . Asthma   . Chlamydia   . Depression   . Migraine   . Back pain   . IBS (irritable bowel syndrome)   . Trichomonal vaginitis 06/04/2013  . Lymph nodes enlarged 06/04/2013    Bilateral in groin L>R +trich  . Dysmenorrhea 06/04/2013   Past Surgical History  Procedure Laterality Date  . Tonsillectomy     History reviewed. No pertinent family history. History  Substance Use Topics  . Smoking status: Current Some Day Smoker -- 0.50 packs/day    Types: Cigarettes  . Smokeless tobacco: Never Used  . Alcohol Use: No   OB History   Grav Para Term Preterm Abortions TAB SAB Ect Mult Living   3    3  3         Review of Systems  Constitutional: Negative for fever and activity change.       All ROS Neg except as noted in HPI  HENT: Positive for dental problem. Negative for nosebleeds.   Eyes: Negative for photophobia and discharge.  Respiratory: Negative for cough, shortness of breath and wheezing.   Cardiovascular: Negative for chest pain and palpitations.  Gastrointestinal: Negative for abdominal pain and blood in stool.   Genitourinary: Negative for dysuria, frequency and hematuria.  Musculoskeletal: Positive for back pain. Negative for arthralgias and neck pain.  Skin: Negative.   Neurological: Positive for headaches. Negative for dizziness, seizures and speech difficulty.  Psychiatric/Behavioral: Negative for hallucinations and confusion.      Allergies  Raspberry and Other  Home Medications   Prior to Admission medications   Medication Sig Start Date End Date Taking? Authorizing Provider  albuterol (PROVENTIL HFA;VENTOLIN HFA) 108 (90 BASE) MCG/ACT inhaler Inhale 2 puffs into the lungs every 4 (four) hours as needed for wheezing or shortness of breath. 07/12/13  Yes Gilda Creasehristopher J. Pollina, MD  ibuprofen (ADVIL,MOTRIN) 200 MG tablet Take 800-1,000 mg by mouth daily as needed for mild pain or moderate pain.   Yes Historical Provider, MD   Pulse 71  Temp(Src) 98.2 F (36.8 C) (Oral)  Resp 20  Wt 112 lb (50.803 kg)  SpO2 99%  LMP 12/01/2013 Physical Exam  Nursing note and vitals reviewed. Constitutional: She is oriented to person, place, and time. She appears well-developed and well-nourished.  Non-toxic appearance.  HENT:  Head: Normocephalic.  Right Ear: Tympanic membrane and external ear normal.  Left Ear: Tympanic membrane and external ear normal.  Mouth/Throat: Uvula is midline, oropharynx is clear and moist and mucous membranes are normal. Abnormal dentition. Dental caries present.  Multiple dental caries. Gum swelling on the left. Airway patent. No swelling under  the tongue.  Eyes: EOM and lids are normal. Pupils are equal, round, and reactive to light.  Neck: Normal range of motion. Neck supple. Carotid bruit is not present.  Cardiovascular: Normal rate, regular rhythm, normal heart sounds, intact distal pulses and normal pulses.   Pulmonary/Chest: Breath sounds normal. No respiratory distress.  Abdominal: Soft. Bowel sounds are normal. There is no tenderness. There is no guarding.   Musculoskeletal: Normal range of motion.  Lymphadenopathy:       Head (right side): No submandibular adenopathy present.       Head (left side): No submandibular adenopathy present.    She has no cervical adenopathy.  Neurological: She is alert and oriented to person, place, and time. She has normal strength. No cranial nerve deficit or sensory deficit.  Skin: Skin is warm and dry.  Psychiatric: She has a normal mood and affect. Her speech is normal.    ED Course  Procedures (including critical care time) Labs Review Labs Reviewed - No data to display  Imaging Review No results found.   EKG Interpretation None      MDM Pt has dental caries and evidence of gingival disease. Pt strongly encouraged to see a dentist as soon as possible. No evidence for Ludwig's Angina. Rx for amoxil, ibuprofen, and tylenol codeine given to the patient.   Final diagnoses:  None    **I have reviewed nursing notes, vital signs, and all appropriate lab and imaging results for this patient.Kathie Dike*    Manjinder Breau M Faatima Tench, PA-C 12/02/13 2226

## 2013-12-01 NOTE — Discharge Instructions (Signed)
It is very important that you see a dentist as soon as possible. Please use Amoxil and ibuprofen 3 times daily with food until all taken. Please use Ultram every 6 hours if needed for pain. Ultram may cause drowsiness, please use with caution. Dental Pain Toothache is pain in or around a tooth. It may get worse with chewing or with cold or heat.  HOME CARE  Your dentist may use a numbing medicine during treatment. If so, you may need to avoid eating until the medicine wears off. Ask your dentist about this.  Only take medicine as told by your dentist or doctor.  Avoid chewing food near the painful tooth until after all treatment is done. Ask your dentist about this. GET HELP RIGHT AWAY IF:   The problem gets worse or new problems appear.  You have a fever.  There is redness and puffiness (swelling) of the face, jaw, or neck.  You cannot open your mouth.  There is pain in the jaw.  There is very bad pain that is not helped by medicine. MAKE SURE YOU:   Understand these instructions.  Will watch your condition.  Will get help right away if you are not doing well or get worse. Document Released: 10/31/2007 Document Revised: 08/06/2011 Document Reviewed: 10/31/2007 [facility] Patient Information 2015 [place], [place]. This information is not intended to replace advice given to you by your health care provider. Make sure you discuss any questions you have with your health care provider.

## 2013-12-01 NOTE — ED Notes (Signed)
Jaw pain for over 1 week, on left

## 2013-12-02 NOTE — ED Provider Notes (Signed)
Medical screening examination/treatment/procedure(s) were performed by non-physician practitioner and as supervising physician I was immediately available for consultation/collaboration.   EKG Interpretation None      Devoria AlbeIva Yalitza Teed, MD, Armando GangFACEP   Ward GivensIva L Tavari Loadholt, MD 12/02/13 2352

## 2014-01-11 ENCOUNTER — Emergency Department (HOSPITAL_COMMUNITY)
Admission: EM | Admit: 2014-01-11 | Discharge: 2014-01-12 | Disposition: A | Discharge status: Other Acute Inpatient Hospital | Payer: Medicaid Other | Attending: Emergency Medicine | Admitting: Emergency Medicine

## 2014-01-11 ENCOUNTER — Encounter (HOSPITAL_COMMUNITY): Payer: Self-pay | Admitting: Emergency Medicine

## 2014-01-11 DIAGNOSIS — Z791 Long term (current) use of non-steroidal anti-inflammatories (NSAID): Secondary | ICD-10-CM | POA: Insufficient documentation

## 2014-01-11 DIAGNOSIS — F1210 Cannabis abuse, uncomplicated: Secondary | ICD-10-CM | POA: Insufficient documentation

## 2014-01-11 DIAGNOSIS — F17200 Nicotine dependence, unspecified, uncomplicated: Secondary | ICD-10-CM | POA: Insufficient documentation

## 2014-01-11 DIAGNOSIS — Z792 Long term (current) use of antibiotics: Secondary | ICD-10-CM | POA: Insufficient documentation

## 2014-01-11 DIAGNOSIS — J45909 Unspecified asthma, uncomplicated: Secondary | ICD-10-CM | POA: Insufficient documentation

## 2014-01-11 DIAGNOSIS — Z8719 Personal history of other diseases of the digestive system: Secondary | ICD-10-CM | POA: Insufficient documentation

## 2014-01-11 DIAGNOSIS — Z8659 Personal history of other mental and behavioral disorders: Secondary | ICD-10-CM | POA: Insufficient documentation

## 2014-01-11 DIAGNOSIS — F1110 Opioid abuse, uncomplicated: Secondary | ICD-10-CM | POA: Insufficient documentation

## 2014-01-11 DIAGNOSIS — F1310 Sedative, hypnotic or anxiolytic abuse, uncomplicated: Secondary | ICD-10-CM | POA: Insufficient documentation

## 2014-01-11 DIAGNOSIS — Z008 Encounter for other general examination: Secondary | ICD-10-CM | POA: Insufficient documentation

## 2014-01-11 DIAGNOSIS — Z8679 Personal history of other diseases of the circulatory system: Secondary | ICD-10-CM | POA: Insufficient documentation

## 2014-01-11 DIAGNOSIS — F1910 Other psychoactive substance abuse, uncomplicated: Secondary | ICD-10-CM

## 2014-01-11 DIAGNOSIS — Z87448 Personal history of other diseases of urinary system: Secondary | ICD-10-CM | POA: Insufficient documentation

## 2014-01-11 DIAGNOSIS — Z8619 Personal history of other infectious and parasitic diseases: Secondary | ICD-10-CM | POA: Insufficient documentation

## 2014-01-11 LAB — CBC WITH DIFFERENTIAL/PLATELET
Basophils Absolute: 0 K/uL (ref 0.0–0.1)
Basophils Relative: 0 % (ref 0–1)
Eosinophils Absolute: 0.1 K/uL (ref 0.0–0.7)
Eosinophils Relative: 1 % (ref 0–5)
HCT: 38.2 % (ref 36.0–46.0)
Hemoglobin: 12.4 g/dL (ref 12.0–15.0)
Lymphocytes Relative: 28 % (ref 12–46)
Lymphs Abs: 3.5 K/uL (ref 0.7–4.0)
MCH: 29.2 pg (ref 26.0–34.0)
MCHC: 32.5 g/dL (ref 30.0–36.0)
MCV: 90.1 fL (ref 78.0–100.0)
Monocytes Absolute: 0.8 K/uL (ref 0.1–1.0)
Monocytes Relative: 7 % (ref 3–12)
Neutro Abs: 8.2 K/uL — ABNORMAL HIGH (ref 1.7–7.7)
Neutrophils Relative %: 64 % (ref 43–77)
Platelets: 295 K/uL (ref 150–400)
RBC: 4.24 MIL/uL (ref 3.87–5.11)
RDW: 13.9 % (ref 11.5–15.5)
WBC: 12.7 K/uL — ABNORMAL HIGH (ref 4.0–10.5)

## 2014-01-11 LAB — RAPID URINE DRUG SCREEN, HOSP PERFORMED
Amphetamines: NOT DETECTED
BENZODIAZEPINES: POSITIVE — AB
Barbiturates: NOT DETECTED
Cocaine: NOT DETECTED
Opiates: POSITIVE — AB
TETRAHYDROCANNABINOL: POSITIVE — AB

## 2014-01-11 LAB — POC URINE PREG, ED: Preg Test, Ur: NEGATIVE

## 2014-01-11 LAB — BASIC METABOLIC PANEL WITH GFR
Anion gap: 13 (ref 5–15)
BUN: 7 mg/dL (ref 6–23)
CO2: 28 meq/L (ref 19–32)
Calcium: 9.7 mg/dL (ref 8.4–10.5)
Chloride: 102 meq/L (ref 96–112)
Creatinine, Ser: 0.82 mg/dL (ref 0.50–1.10)
GFR calc Af Amer: >90 mL/min
GFR calc non Af Amer: >90 mL/min
Glucose, Bld: 91 mg/dL (ref 70–99)
Potassium: 5.1 meq/L (ref 3.7–5.3)
Sodium: 143 meq/L (ref 137–147)

## 2014-01-11 LAB — ETHANOL: Alcohol, Ethyl (B): <11 mg/dL (ref 0–11)

## 2014-01-11 MED ORDER — LORAZEPAM 2 MG/ML IJ SOLN: 0.0000 mg | Freq: Four times a day (QID) | INTRAMUSCULAR | Status: DC

## 2014-01-11 MED ORDER — LORAZEPAM 2 MG/ML IJ SOLN: 0.0000 mg | Freq: Two times a day (BID) | INTRAMUSCULAR | Status: DC

## 2014-01-11 MED ORDER — ZOLPIDEM TARTRATE 5 MG PO TABS: 5.0000 mg | ORAL_TABLET | Freq: Every evening | ORAL | Status: DC | PRN

## 2014-01-11 MED ORDER — LORAZEPAM 1 MG PO TABS
0.0000 mg | ORAL_TABLET | Freq: Two times a day (BID) | ORAL | Status: DC
  Filled 2014-01-11: qty 1

## 2014-01-11 MED ORDER — THIAMINE HCL 100 MG/ML IJ SOLN: 100.0000 mg | Freq: Every day | INTRAMUSCULAR | Status: DC

## 2014-01-11 MED ORDER — NICOTINE 21 MG/24HR TD PT24
21.0000 mg | MEDICATED_PATCH | Freq: Every day | TRANSDERMAL | Status: DC
  Administered 2014-01-11: 21 mg via TRANSDERMAL
  Filled 2014-01-11: qty 1

## 2014-01-11 MED ORDER — IBUPROFEN 400 MG PO TABS: 600.0000 mg | ORAL_TABLET | Freq: Three times a day (TID) | ORAL | Status: DC | PRN

## 2014-01-11 MED ORDER — VITAMIN B-1 100 MG PO TABS: 100.0000 mg | ORAL_TABLET | Freq: Every day | ORAL | Status: DC

## 2014-01-11 MED ORDER — LORAZEPAM 1 MG PO TABS
0.0000 mg | ORAL_TABLET | Freq: Four times a day (QID) | ORAL | Status: DC
  Administered 2014-01-11 (×2): 1 mg via ORAL
  Filled 2014-01-11: qty 1

## 2014-01-11 MED ORDER — ACETAMINOPHEN 325 MG PO TABS: 650.0000 mg | ORAL_TABLET | ORAL | Status: DC | PRN

## 2014-01-11 NOTE — Discharge Instructions (Signed)
Opioid Withdrawal °Opioids are a group of narcotic drugs. They include the street drug heroin. They also include pain medicines, such as morphine, hydrocodone, oxycodone, and fentanyl. Opioid withdrawal is a group of characteristic physical and mental signs and symptoms. It typically occurs if you have been using opioids daily for several weeks or longer and stop using or rapidly decrease use. Opioid withdrawal can also occur if you have used opioids daily for a long time and are given a medicine to block the effect.  °SIGNS AND SYMPTOMS °Opioid withdrawal includes three or more of the following symptoms:  °· Depressed, anxious, or irritable mood. °· Nausea or vomiting. °· Muscle aches or spasms.   °· Watery eyes.    °· Runny nose. °· Dilated pupils, sweating, or hairs standing on end. °· Diarrhea or intestinal cramping. °· Yawning.   °· Fever. °· Increased blood pressure. °· Fast pulse. °· Restlessness or trouble sleeping. °These signs and symptoms occur within several hours of stopping or reducing short-acting opioids, such as heroin. They can occur within 3 days of stopping or reducing long-acting opioids, such as methadone. Withdrawal begins within minutes of receiving a drug that blocks the effects of opioids, such as naltrexone or naloxone. °DIAGNOSIS  °Opioid use disorder is diagnosed by your health care provider. You will be asked about your symptoms, drug and alcohol use, medical history, and use of medicines. A physical exam may be done. Lab tests may be ordered. Your health care provider may have you see a mental health professional.  °TREATMENT  °The treatment for opioid withdrawal is usually provided by medical doctors with special training in substance use disorders (addiction specialists). The following medicines may be included in treatment: °· Opioids given in place of the abused opioid. They turn on opioid receptors in the brain and lessen or prevent withdrawal symptoms. They are gradually  decreased (opioid substitution and taper). °· Non-opioids that can lessen certain opioid withdrawal symptoms. They may be used alone or with opioid substitution and taper. °Successful long-term recovery usually requires medicine, counseling, and group support. °HOME CARE INSTRUCTIONS  °· Take medicines only as directed by your health care provider. °· Check with your health care provider before starting new medicines. °· Keep all follow-up visits as directed by your health care provider. °SEEK MEDICAL CARE IF: °· You are not able to take your medicines as directed. °· Your symptoms get worse. °· You relapse. °SEEK IMMEDIATE MEDICAL CARE IF: °· You have serious thoughts about hurting yourself or others. °· You have a seizure. °· You lose consciousness. °Document Released: 05/17/2003 Document Revised: 09/28/2013 Document Reviewed: 05/27/2013 °ExitCare® Patient Information ©2015 Exit[employer], LLC. This information is not intended to replace advice given to you by your health care provider. Make sure you discuss any questions you have with your health care provider. ° ° °Opioid Use Disorder °Opioid use disorder is a mental disorder. It is the continued nonmedical use of opioids in spite of risks to health and well-being. Misused opioids include the street drug heroin. They also include pain medicines such as morphine, hydrocodone, oxycodone, and fentanyl. Opioids are very addictive. People who misuse opioids get an exaggerated feeling of well-being. Opioid use disorder often disrupts activities at home, work, or school. It may cause mental or physical problems.  °A family history of opioid use disorder puts you at higher risk of it. People with opioid use disorder often misuse other drugs or have mental illness such as depression, posttraumatic stress disorder, or antisocial personality disorder. They also are   at risk of suicide and death from overdose. °SIGNS AND SYMPTOMS  °Signs and symptoms of opioid use disorder  include: °· Use of opioids in larger amounts or over a longer period than intended. °· Unsuccessful attempts to cut down or control opioid use. °· A lot of time spent obtaining, using, or recovering from the effects of opioids. °· A strong desire or urge to use opioids (craving). °· Continued use of opioids in spite of major problems at work, school, or home because of use. °· Continued use of opioids in spite of relationship problems because of use. °· Giving up or cutting down on important life activities because of opioid use. °· Use of opioids over and over in situations when it is physically hazardous, such as driving a car. °· Continued use of opioids in spite of a physical problem that is likely related to use. Physical problems can include: °· Severe constipation. °· Poor nutrition. °· Infertility. °· Tuberculosis. °· Aspiration pneumonia. °· Infections such as human immunodeficiency virus (HIV) and hepatitis (from injecting opioids). °· Continued use of opioids in spite of a mental problem that is likely related to use. Mental problems can include: °· Depression. °· Anxiety. °· Hallucinations. °· Sleep problems. °· Loss of sexual function. °· Need to use more and more opioids to get the same effect, or lessened effect over time with use of the same amount (tolerance). °· Having withdrawal symptoms when opioid use is stopped, or using opioids to reduce or avoid withdrawal symptoms. Withdrawal symptoms include: °· Depressed, anxious, or irritable mood. °· Nausea, vomiting, diarrhea, or intestinal cramping. °· Muscle aches or spasms. °· Excessive tearing or runny nose. °· Dilated pupils, sweating, or hairs standing on end. °· Yawning. °· Fever, raised blood pressure, or fast pulse. °· Restlessness or trouble sleeping. This does not apply to people taking opioids for medical reasons only. °DIAGNOSIS °Opioid use disorder is diagnosed by your health care provider. You may be asked questions about your opioid use  and and how it affects your life. A physical exam may be done. A drug screen may be ordered. You may be referred to a mental health professional. The diagnosis of opioid use disorder requires at least two symptoms within 12 months. The type of opioid use disorder you have depends on the number of signs and symptoms you have. The type may be: °· Mild. Two or three signs and symptoms.    °· Moderate. Four or five signs and symptoms.   °· Severe. Six or more signs and symptoms. °TREATMENT  °Treatment is usually provided by mental health professionals with training in substance use disorders. The following options are available: °· Detoxification. This is the first step in treatment for withdrawal. It is medically supervised withdrawal with the use of medicines. These medicines lessen withdrawal symptoms. They also raise the chance of becoming opioid free. °· Counseling, also known as talk therapy. Talk therapy addresses the reasons you use opioids. It also addresses ways to keep you from using again (relapse). The goals of talk therapy are to avoid relapse by: °· Identifying and avoiding triggers for use. °· Finding healthy ways to cope with stress. °· Learning how to handle cravings. °· Support groups. Support groups provide emotional support, advice, and guidance. °· A medicine that blocks opioid receptors in your brain. This medicine can reduce opioid cravings that lead to relapse. This medicine also blocks the desired opioid effect when relapse occurs. °· Opioids that are taken by mouth in place of the misused opioid (opioid   maintenance treatment). These medicines satisfy cravings but are safer than commonly misused opioids. This often is the best option for people who continue to relapse with other treatments. °HOME CARE INSTRUCTIONS  °· Take medicines only as directed by your health care provider. °· Check with your health care provider before starting new medicines. °· Keep all follow-up visits as directed by  your health care provider. °SEEK MEDICAL CARE IF: °· You are not able to take your medicines as directed. °· Your symptoms get worse. °SEEK IMMEDIATE MEDICAL CARE IF: °· You have serious thoughts about hurting yourself or others. °· You may have taken an overdose of opioids. °FOR MORE INFORMATION °· National Institute on Drug Abuse: www.drugabuse.gov °· Substance Abuse and Mental Health Services Administration: www.samhsa.gov °Document Released: 03/11/2007 Document Revised: 09/28/2013 Document Reviewed: 05/27/2013 °ExitCare® Patient Information ©2015 [employer]. This information is not intended to replace advice given to you by your health care provider. Make sure you discuss any questions you have with your health care provider. ° °

## 2014-01-11 NOTE — Progress Notes (Signed)
Per Dr. Gwendolyn GrantWalden the patient denies SI, HI and psychosis.  Patient requests detox from opiates.  Writer faxed resource information to [facility].

## 2014-01-11 NOTE — ED Notes (Signed)
Spoke with Ala DachFord. Ford stated that [facility] has bed available but will let him know whether they will accept pt or not. He expects to know something within the next hour and 1/2.

## 2014-01-11 NOTE — ED Provider Notes (Signed)
CSN: [id number]     Arrival date & time 01/11/14  1615 History  This chart was scribed for Audree Camel, MD by Milly Jakob, ED Scribe. The patient was seen in room APA16A/APA16A. Patient's care was started at 5:49 PM.   Chief Complaint  Patient presents with  . Addiction Problem   The history is provided by the patient. No language interpreter was used.  HPI Comments: Mary [name] is a 23 y.o. female who presents to the Emergency Department complaining of an addiction problem with opiates and she states that she wants to detox. She reports that she was sent here by [facility]. She reports that she took 16 MG of Dilaudid 6 hours ago. She reports getting hot flashes for the past 45 minutes. She reports that she has been taking opiates since she was 12, and shooting up for the past two years. She denies suicidal ideations or homicidal ideations.  She does not drink alcohol or use cocaine.    Past Medical History  Diagnosis Date  . Asthma   . Chlamydia   . Depression   . Migraine   . Back pain   . IBS (irritable bowel syndrome)   . Trichomonal vaginitis 06/04/2013  . Lymph nodes enlarged 06/04/2013    Bilateral in groin L>R +trich  . Dysmenorrhea 06/04/2013   Past Surgical History  Procedure Laterality Date  . Tonsillectomy     No family history on file. History  Substance Use Topics  . Smoking status: Current Some Day Smoker -- 0.50 packs/day    Types: Cigarettes  . Smokeless tobacco: Never Used  . Alcohol Use: No   OB History   Grav Para Term Preterm Abortions TAB SAB Ect Mult Living   3    3  3         Review of Systems  All other systems reviewed and are negative.   Allergies  Raspberry; Tramadol; and Other  Home Medications   Prior to Admission medications   Medication Sig Start Date End Date Taking? Authorizing Provider  acetaminophen-codeine (TYLENOL #3) 300-30 MG per tablet Take 1-2 tablets by mouth every 6 (six) hours as needed for moderate pain. 12/01/13   Kathie Dike, PA-C  albuterol (PROVENTIL HFA;VENTOLIN HFA) 108 (90 BASE) MCG/ACT inhaler Inhale 2 puffs into the lungs every 4 (four) hours as needed for wheezing or shortness of breath. 07/12/13   Gilda Crease, MD  amoxicillin (AMOXIL) 500 MG capsule Take 1 capsule (500 mg total) by mouth 3 (three) times daily. 12/01/13   Kathie Dike, PA-C  ibuprofen (ADVIL,MOTRIN) 200 MG tablet Take 800-1,000 mg by mouth daily as needed for mild pain or moderate pain.    Historical Provider, MD  ibuprofen (ADVIL,MOTRIN) 800 MG tablet Take 1 tablet (800 mg total) by mouth 3 (three) times daily. 12/01/13   Kathie Dike, PA-C   Triage Vitals: BP 124/84  Pulse 88  Temp(Src) 98.8 F (37.1 C) (Oral)  Ht 5' (1.524 m)  Wt 110 lb (49.896 kg)  BMI 21.48 kg/m2  SpO2 100%  LMP 01/11/2014 Physical Exam  Nursing note and vitals reviewed. Constitutional: She is oriented to person, place, and time. She appears well-developed and well-nourished. No distress.  HENT:  Head: Normocephalic and atraumatic.  Eyes: EOM are normal. Pupils are equal, round, and reactive to light.  Neck: Neck supple. No tracheal deviation present.  Cardiovascular: Normal rate, regular rhythm and normal heart sounds.   Pulmonary/Chest: Effort normal. No respiratory  distress.  Abdominal: She exhibits no distension.  Neurological: She is alert and oriented to person, place, and time.  Skin: Skin is warm and dry. She is not diaphoretic.  Psychiatric: She has a normal mood and affect. Her behavior is normal.    ED Course  Procedures (including critical care time) DIAGNOSTIC STUDIES: Oxygen Saturation is 100% on room air, normal by my interpretation.    COORDINATION OF CARE: 5:53 PM-Discussed treatment plan with pt at bedside and pt agreed to plan.   Labs Review Labs Reviewed  URINE RAPID DRUG SCREEN (HOSP PERFORMED) - Abnormal; Notable for the following:    Opiates POSITIVE (*)    Benzodiazepines POSITIVE (*)     Tetrahydrocannabinol POSITIVE (*)    All other components within normal limits  CBC WITH DIFFERENTIAL - Abnormal; Notable for the following:    WBC 12.7 (*)    Neutro Abs 8.2 (*)    All other components within normal limits  BASIC METABOLIC PANEL  ETHANOL  POC URINE PREG, ED    Imaging Review No results found.   EKG Interpretation None      MDM   Final diagnoses:  Drug abuse    No SI/HI. Discussed with ACT team, they will give resources for outpatient detox given they do not do inpatient opiate detox. Patient amenable to plan.  I personally performed the services described in this documentation, which was scribed in my presence. The recorded information has been reviewed and is accurate.    Audree CamelScott T Nikolaos Maddocks, MD 01/11/14 2141

## 2014-01-11 NOTE — ED Notes (Signed)
Patient's mother left name and number and wants to be called when patient is transferred. Patient verbalized agreement. Mother's name is Lucretia KernJuanita Matherly, number is [phone].

## 2014-01-11 NOTE — ED Notes (Addendum)
Pt states she has been addicted to opiates for 11 years, pt wants to detox. Last use was today 1200 dilaudid about 16mg  IV
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
## 2014-01-12 ENCOUNTER — Emergency Department: Payer: Self-pay | Admitting: Emergency Medicine

## 2014-01-12 LAB — URINALYSIS, COMPLETE
BILIRUBIN, UR: NEGATIVE
Blood: NEGATIVE
Glucose,UR: NEGATIVE mg/dL (ref 0–75)
Ketone: NEGATIVE
Leukocyte Esterase: NEGATIVE
Nitrite: NEGATIVE
Ph: 6 (ref 4.5–8.0)
Protein: NEGATIVE
RBC, UR: NONE SEEN /HPF (ref 0–5)
Specific Gravity: 1.004 (ref 1.003–1.030)
WBC UR: NONE SEEN /HPF (ref 0–5)

## 2014-01-12 LAB — CBC WITH DIFFERENTIAL/PLATELET
BASOS ABS: 0.1 10*3/uL (ref 0.0–0.1)
Basophil %: 0.3 %
EOS ABS: 0 10*3/uL (ref 0.0–0.7)
Eosinophil %: 0.1 %
HCT: 38.6 % (ref 35.0–47.0)
HGB: 12.4 g/dL (ref 12.0–16.0)
LYMPHS PCT: 8.9 %
Lymphocyte #: 1.6 10*3/uL (ref 1.0–3.6)
MCH: 28.4 pg (ref 26.0–34.0)
MCHC: 32 g/dL (ref 32.0–36.0)
MCV: 89 fL (ref 80–100)
Monocyte #: 0.8 x10 3/mm (ref 0.2–0.9)
Monocyte %: 4.6 %
NEUTROS ABS: 15.7 10*3/uL — AB (ref 1.4–6.5)
NEUTROS PCT: 86.1 %
PLATELETS: 285 10*3/uL (ref 150–440)
RBC: 4.36 10*6/uL (ref 3.80–5.20)
RDW: 13.6 % (ref 11.5–14.5)
WBC: 18.3 10*3/uL — ABNORMAL HIGH (ref 3.6–11.0)

## 2014-01-12 LAB — BASIC METABOLIC PANEL
Anion Gap: 13 (ref 7–16)
BUN: 8 mg/dL (ref 7–18)
CALCIUM: 8.9 mg/dL (ref 8.5–10.1)
Chloride: 104 mmol/L (ref 98–107)
Co2: 24 mmol/L (ref 21–32)
Creatinine: 0.89 mg/dL (ref 0.60–1.30)
Glucose: 111 mg/dL — ABNORMAL HIGH (ref 65–99)
OSMOLALITY: 280 (ref 275–301)
Potassium: 3.6 mmol/L (ref 3.5–5.1)
Sodium: 141 mmol/L (ref 136–145)

## 2014-01-12 NOTE — ED Notes (Signed)
Attempted to call patient's mother as requested. No answer at number given. Could not leave a message due to "mailbox not set up."

## 2014-01-12 NOTE — ED Provider Notes (Signed)
11:00 PM  Pt here for detox from opiates. Discussed with [facility] from behavioral health [facility]. They are attempting to get [place] [facility] [place].  We'll hold on discharge at this time.  Layla MawKristen N Ward, DO 01/12/14 0004

## 2014-01-12 NOTE — BH Assessment (Signed)
[facility] Assessment Progress Note   Clinician received a call from Patsy at [facility] at 00:30.  Pt has been accepted there for detox.  Nurse can call report to [phone].  They request [facility].  Clinician informed nurse Gunnar FusiPaula and Dr. Estell HarpinZammit of patient acceptance.  Clinician let nurse know that [facility] always likes to be called as patient is leaving [facility].

## 2014-01-14 LAB — URINE CULTURE

## 2014-01-14 IMAGING — CR DG LUMBAR SPINE COMPLETE 4+V
5 series · 5 of 5 positions shown · non-contrast
Comparison: 08/05/2008 MR.

CLINICAL DATA: Back pain.  Injury after moving couch.

LUMBAR SPINE - COMPLETE 4+ VIEW

[view not recorded (1 of 5)]
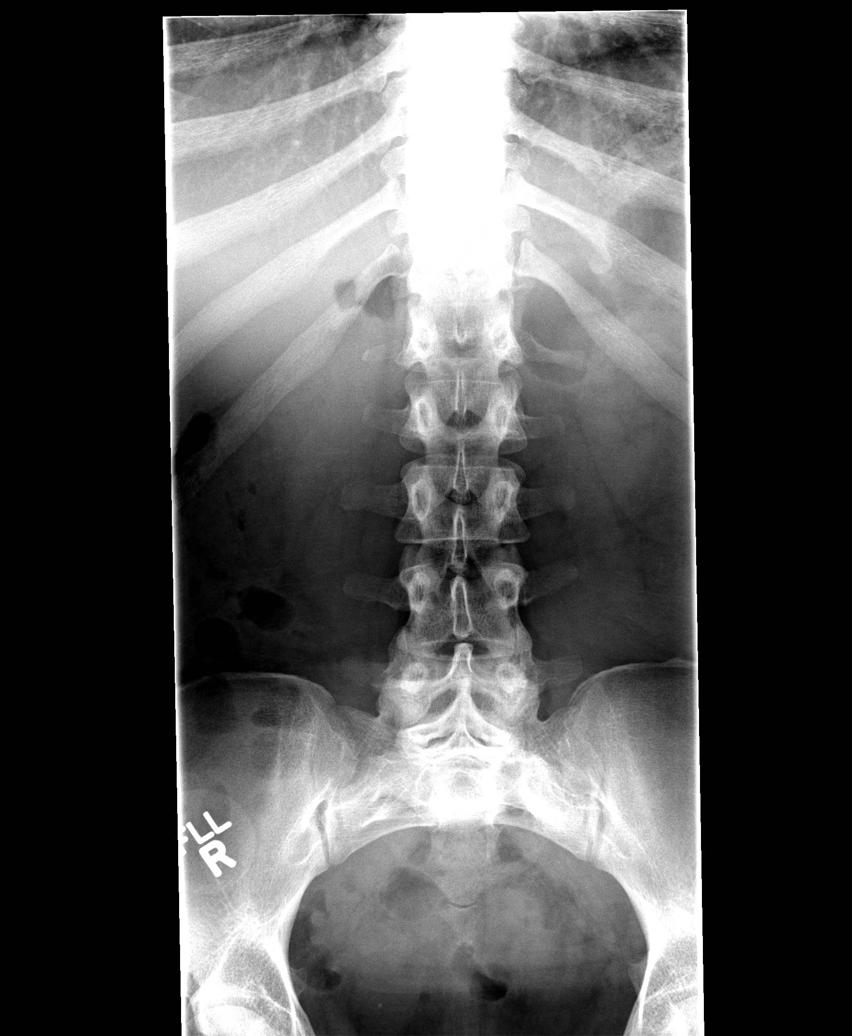

[view not recorded (2 of 5)]
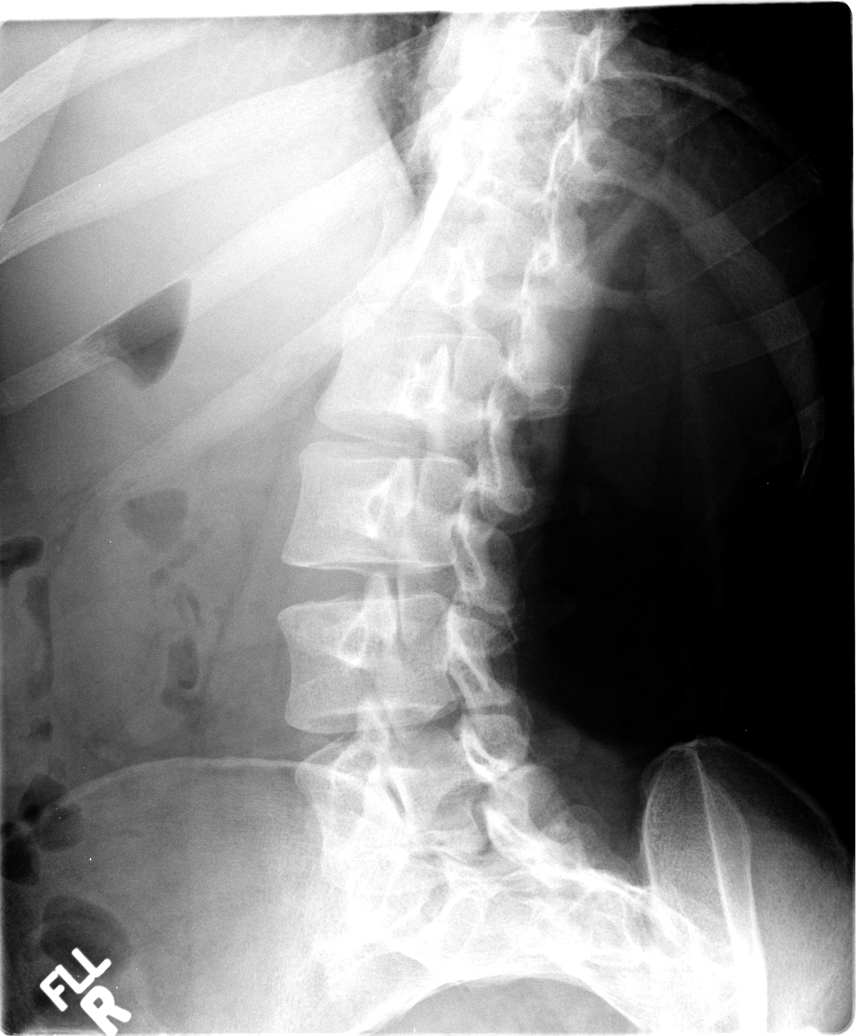

[view not recorded (3 of 5)]
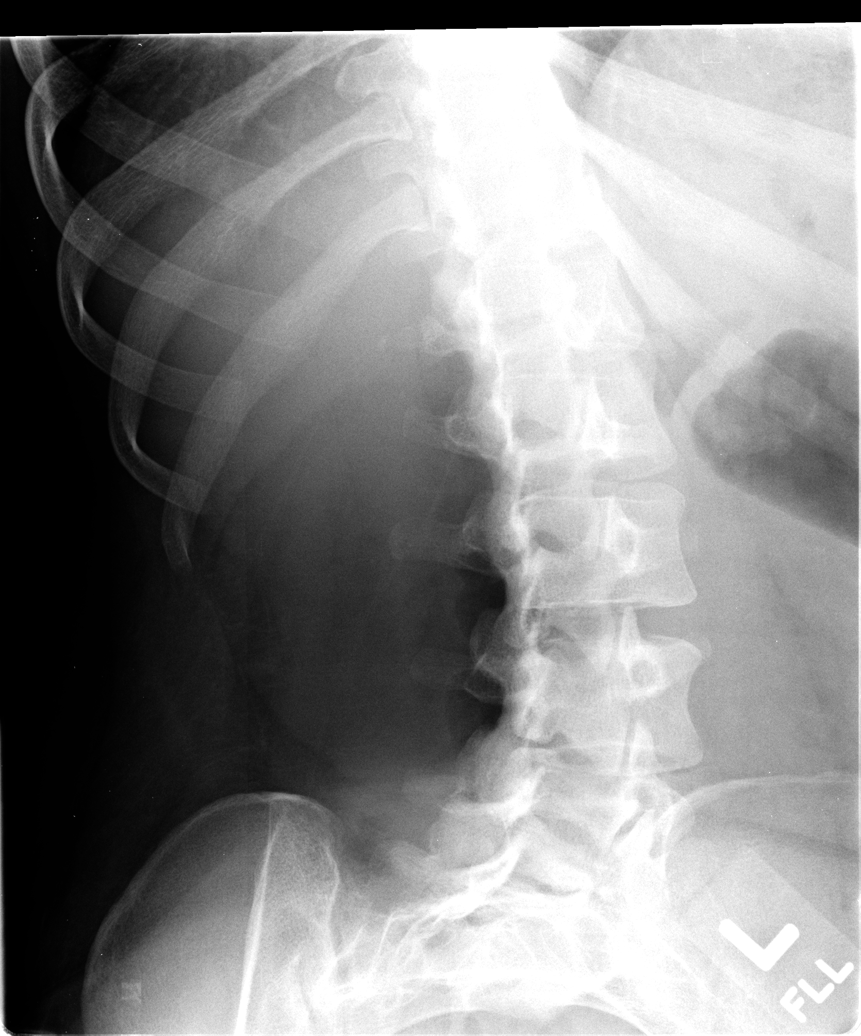

[view not recorded (4 of 5)]
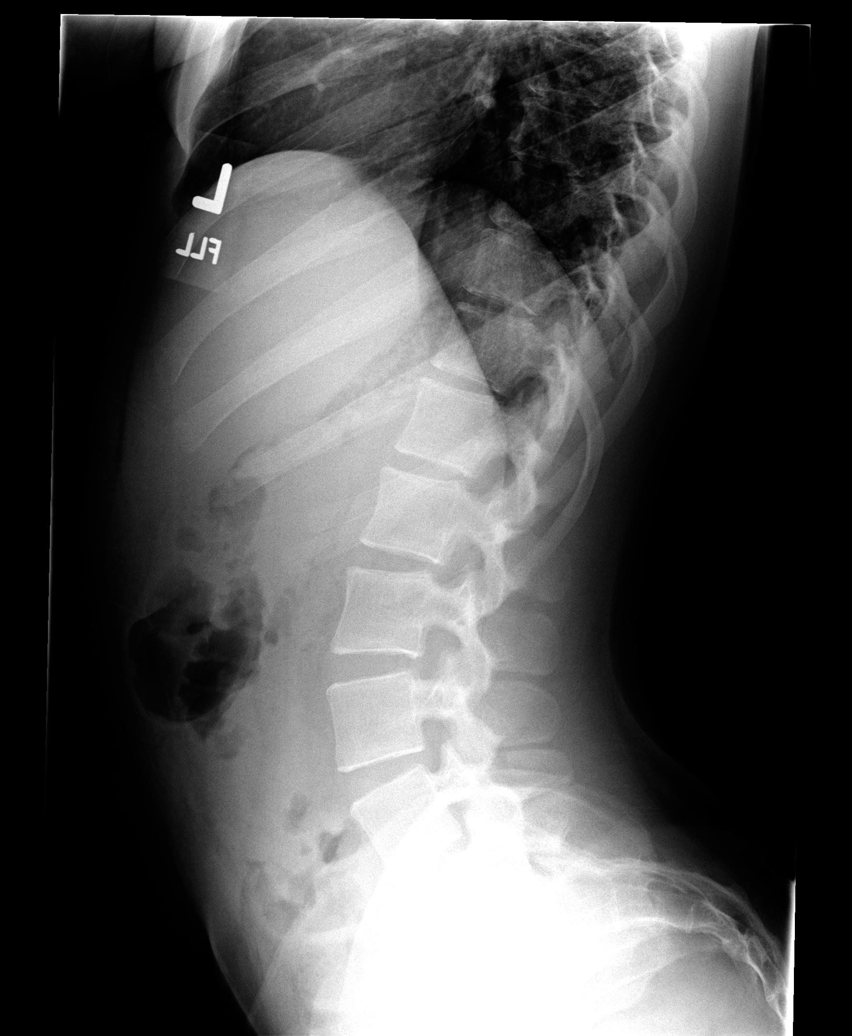

[view not recorded (5 of 5)]
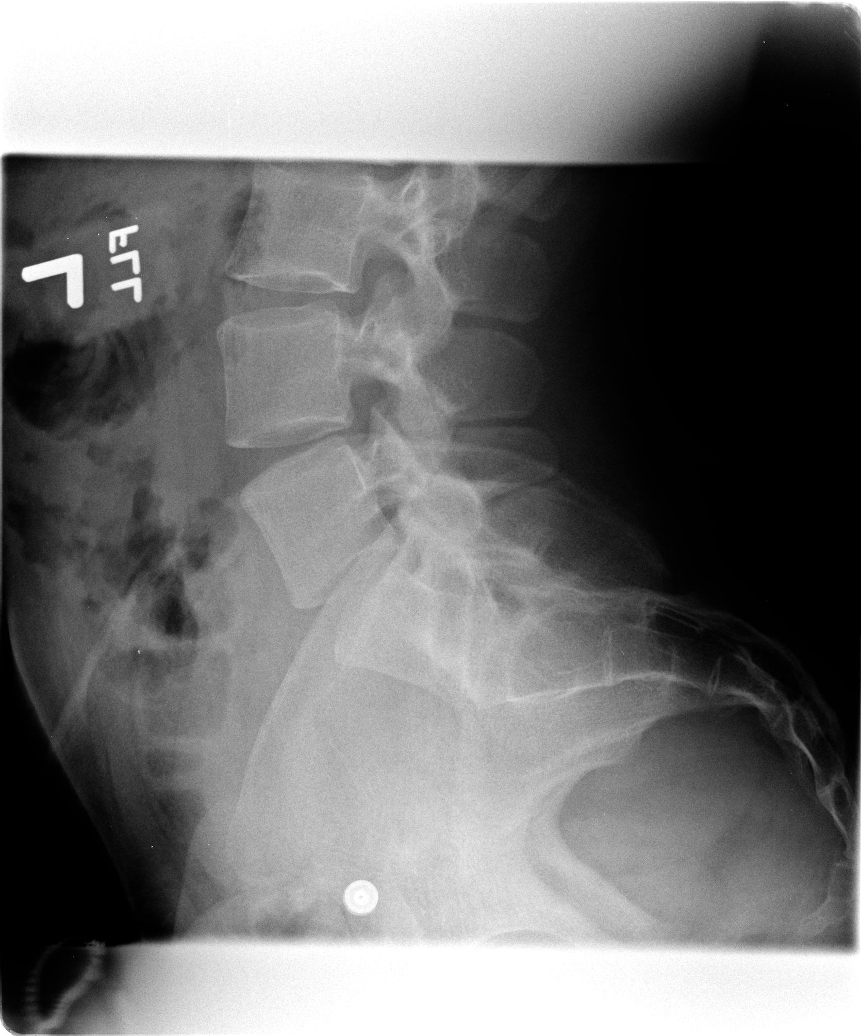

[5 of 5 positions shown; findings below may reference images not displayed]

FINDINGS: Transitional vertebra previously labeled S1 with a
rudimentary disc at the S1-S2 level.

Minimal L4-5 and L5-S1 disc space narrowing.
IMPRESSION: Minimal L4-5 and L5-S1 disc space narrowing.

Transitional S1 vertebral body as noted on prior MR.

## 2014-01-17 LAB — CULTURE, BLOOD (SINGLE)

## 2014-03-25 ENCOUNTER — Encounter (HOSPITAL_COMMUNITY): Payer: Self-pay | Admitting: Emergency Medicine

## 2014-03-25 ENCOUNTER — Emergency Department (HOSPITAL_COMMUNITY)
Admission: EM | Admit: 2014-03-25 | Discharge: 2014-03-25 | Discharge status: Against Medical Advice | Payer: Medicaid Other | Attending: Emergency Medicine | Admitting: Emergency Medicine

## 2014-03-25 DIAGNOSIS — R102 Pelvic and perineal pain: Secondary | ICD-10-CM | POA: Insufficient documentation

## 2014-03-25 DIAGNOSIS — J45909 Unspecified asthma, uncomplicated: Secondary | ICD-10-CM | POA: Insufficient documentation

## 2014-03-25 DIAGNOSIS — R197 Diarrhea, unspecified: Secondary | ICD-10-CM | POA: Insufficient documentation

## 2014-03-25 DIAGNOSIS — Z720 Tobacco use: Secondary | ICD-10-CM | POA: Insufficient documentation

## 2014-03-25 DIAGNOSIS — R1110 Vomiting, unspecified: Secondary | ICD-10-CM | POA: Insufficient documentation

## 2014-03-25 HISTORY — DX: Herpesviral infection, unspecified: B00.9

## 2014-03-25 NOTE — ED Notes (Signed)
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

## 2014-03-29 ENCOUNTER — Encounter (HOSPITAL_COMMUNITY): Payer: Self-pay | Admitting: Emergency Medicine

## 2014-09-29 ENCOUNTER — Emergency Department (HOSPITAL_COMMUNITY): Payer: Medicaid Other

## 2014-09-29 ENCOUNTER — Emergency Department (HOSPITAL_COMMUNITY)
Admission: EM | Admit: 2014-09-29 | Discharge: 2014-09-29 | Disposition: A | Discharge status: Home / Self Care | Payer: Medicaid Other | Attending: Emergency Medicine | Admitting: Emergency Medicine

## 2014-09-29 ENCOUNTER — Encounter (HOSPITAL_COMMUNITY): Payer: Self-pay | Admitting: Emergency Medicine

## 2014-09-29 DIAGNOSIS — Z8679 Personal history of other diseases of the circulatory system: Secondary | ICD-10-CM | POA: Insufficient documentation

## 2014-09-29 DIAGNOSIS — Z8659 Personal history of other mental and behavioral disorders: Secondary | ICD-10-CM | POA: Insufficient documentation

## 2014-09-29 DIAGNOSIS — Z79899 Other long term (current) drug therapy: Secondary | ICD-10-CM | POA: Insufficient documentation

## 2014-09-29 DIAGNOSIS — Z3202 Encounter for pregnancy test, result negative: Secondary | ICD-10-CM | POA: Insufficient documentation

## 2014-09-29 DIAGNOSIS — N730 Acute parametritis and pelvic cellulitis: Secondary | ICD-10-CM

## 2014-09-29 DIAGNOSIS — Z720 Tobacco use: Secondary | ICD-10-CM | POA: Insufficient documentation

## 2014-09-29 DIAGNOSIS — N739 Female pelvic inflammatory disease, unspecified: Secondary | ICD-10-CM | POA: Insufficient documentation

## 2014-09-29 DIAGNOSIS — A5901 Trichomonal vulvovaginitis: Secondary | ICD-10-CM

## 2014-09-29 DIAGNOSIS — Z8719 Personal history of other diseases of the digestive system: Secondary | ICD-10-CM | POA: Insufficient documentation

## 2014-09-29 DIAGNOSIS — R102 Pelvic and perineal pain: Secondary | ICD-10-CM

## 2014-09-29 DIAGNOSIS — J45909 Unspecified asthma, uncomplicated: Secondary | ICD-10-CM | POA: Insufficient documentation

## 2014-09-29 LAB — CBC WITH DIFFERENTIAL/PLATELET
BASOS ABS: 0 10*3/uL (ref 0.0–0.1)
BASOS PCT: 0 % (ref 0–1)
EOS PCT: 8 % — AB (ref 0–5)
Eosinophils Absolute: 0.7 10*3/uL (ref 0.0–0.7)
HEMATOCRIT: 41 % (ref 36.0–46.0)
Hemoglobin: 13 g/dL (ref 12.0–15.0)
LYMPHS PCT: 40 % (ref 12–46)
Lymphs Abs: 3.7 10*3/uL (ref 0.7–4.0)
MCH: 29 pg (ref 26.0–34.0)
MCHC: 31.7 g/dL (ref 30.0–36.0)
MCV: 91.3 fL (ref 78.0–100.0)
MONO ABS: 0.5 10*3/uL (ref 0.1–1.0)
Monocytes Relative: 6 % (ref 3–12)
Neutro Abs: 4.3 10*3/uL (ref 1.7–7.7)
Neutrophils Relative %: 46 % (ref 43–77)
Platelets: 300 10*3/uL (ref 150–400)
RBC: 4.49 MIL/uL (ref 3.87–5.11)
RDW: 12.6 % (ref 11.5–15.5)
WBC: 9.3 10*3/uL (ref 4.0–10.5)

## 2014-09-29 LAB — URINALYSIS, ROUTINE W REFLEX MICROSCOPIC
Bilirubin Urine: NEGATIVE
Glucose, UA: NEGATIVE mg/dL
Ketones, ur: NEGATIVE mg/dL
Leukocytes, UA: NEGATIVE
Nitrite: NEGATIVE
Protein, ur: NEGATIVE mg/dL
SPECIFIC GRAVITY, URINE: 1.025 (ref 1.005–1.030)
UROBILINOGEN UA: 0.2 mg/dL (ref 0.0–1.0)
pH: 6 (ref 5.0–8.0)

## 2014-09-29 LAB — URINE MICROSCOPIC-ADD ON

## 2014-09-29 LAB — WET PREP, GENITAL: Yeast Wet Prep HPF POC: NONE SEEN

## 2014-09-29 LAB — POC URINE PREG, ED: Preg Test, Ur: NEGATIVE

## 2014-09-29 MED ORDER — HYDROCODONE-ACETAMINOPHEN 5-325 MG PO TABS: 1.0000 | ORAL_TABLET | ORAL | Status: DC | PRN

## 2014-09-29 MED ORDER — PROMETHAZINE HCL 12.5 MG PO TABS: 12.5000 mg | ORAL_TABLET | Freq: Four times a day (QID) | ORAL | Status: DC | PRN

## 2014-09-29 MED ORDER — METRONIDAZOLE 500 MG PO TABS: 500.0000 mg | ORAL_TABLET | Freq: Two times a day (BID) | ORAL | Status: DC

## 2014-09-29 MED ORDER — KETOROLAC TROMETHAMINE 30 MG/ML IJ SOLN
30.0000 mg | Freq: Once | INTRAMUSCULAR | Status: AC
  Administered 2014-09-29: 30 mg via INTRAVENOUS
  Filled 2014-09-29: qty 1

## 2014-09-29 MED ORDER — LIDOCAINE HCL (PF) 1 % IJ SOLN
INTRAMUSCULAR | Status: AC
  Filled 2014-09-29: qty 5

## 2014-09-29 MED ORDER — HYDROMORPHONE HCL 1 MG/ML IJ SOLN
INTRAMUSCULAR | Status: AC
  Administered 2014-09-29: 1 mg
  Filled 2014-09-29: qty 1

## 2014-09-29 MED ORDER — CEFTRIAXONE SODIUM 1 G IJ SOLR
1.0000 g | Freq: Once | INTRAMUSCULAR | Status: AC
  Administered 2014-09-29: 1 g via INTRAMUSCULAR
  Filled 2014-09-29: qty 10

## 2014-09-29 MED ORDER — ONDANSETRON HCL 4 MG/2ML IJ SOLN
INTRAMUSCULAR | Status: AC
  Administered 2014-09-29: 4 mg
  Filled 2014-09-29: qty 2

## 2014-09-29 MED ORDER — AZITHROMYCIN 250 MG PO TABS
1000.0000 mg | ORAL_TABLET | Freq: Once | ORAL | Status: AC
Start: 2014-09-29 — End: 2014-09-29
  Administered 2014-09-29: 1000 mg via ORAL
  Filled 2014-09-29: qty 4

## 2014-09-29 MED ORDER — DEXTROSE 5 % IV SOLN
1.0000 g | Freq: Once | INTRAVENOUS | Status: DC
  Filled 2014-09-29: qty 10

## 2014-09-29 NOTE — Discharge Instructions (Signed)
I spoke with Dr. Emelda FearFerguson and he will see you for follow up in the office on Friday at 8:30 am. Take the medication as directed. Return here as needed.

## 2014-09-29 NOTE — ED Provider Notes (Signed)
CSN: [id number]     Arrival date & time 09/29/14  1507 History   First MD Initiated Contact with Patient 09/29/14 1526     Chief Complaint  Patient presents with  . Vaginal Discharge     (Consider location/radiation/quality/duration/timing/severity/associated sxs/prior Treatment) Patient is a 24 y.o. female presenting with pelvic pain and vaginal discharge. The history is provided by the patient.  Pelvic Pain This is a new problem. The current episode started 1 to 4 weeks ago. The problem occurs constantly. The problem has been gradually worsening. Associated symptoms include abdominal pain. She has tried nothing for the symptoms.  Vaginal Discharge Quality:  Bloody Severity:  Moderate Onset quality:  Gradual Duration:  2 weeks Timing:  Constant Progression:  Worsening Chronicity:  New Context: spontaneously   Relieved by:  Nothing Worsened by:  Nothing tried Ineffective treatments:  None tried Associated symptoms: abdominal pain   Risk factors: STI exposure    Iliyana N Cira [name] is a 24 y.o. G3P0030 who presents to the ED with pelvic pain and vaginal d/c that started 2 weeks ago and has gotten worse. Last sexual intercourse 3 months ago and that partner called her and told her he went to the health [facility] and had herpes. She states that she had an outbreak after that but was never tested. Hx of chlamydia and trichomonas. Condoms for birth control. Patient reports that over the past few days that the pelvic pain has gotten severe and the vaginal d/c is bloody. She denies fever, n/v or diarrhea.  Past Medical History  Diagnosis Date  . Asthma   . Chlamydia   . Depression   . Migraine   . Back pain   . IBS (irritable bowel syndrome)   . Trichomonal vaginitis 06/04/2013  . Lymph nodes enlarged 06/04/2013    Bilateral in groin L>R +trich  . Dysmenorrhea 06/04/2013  . Herpes    Past Surgical History  Procedure Laterality Date  . Tonsillectomy     History reviewed. No pertinent  family history. History  Substance Use Topics  . Smoking status: Current Some Day Smoker -- 0.50 packs/day    Types: Cigarettes  . Smokeless tobacco: Never Used  . Alcohol Use: No   OB History    Gravida Para Term Preterm AB TAB SAB Ectopic Multiple Living   3    3  3         Review of Systems  Gastrointestinal: Positive for abdominal pain.  Genitourinary: Positive for vaginal discharge and pelvic pain.  all other systems negative    Allergies  Raspberry; Tramadol; and Other  Home Medications   Prior to Admission medications   Medication Sig Start Date End Date Taking? Authorizing Provider  albuterol (PROVENTIL HFA;VENTOLIN HFA) 108 (90 BASE) MCG/ACT inhaler Inhale 2 puffs into the lungs every 4 (four) hours as needed for wheezing or shortness of breath. 07/12/13   Gilda Creasehristopher J Pollina, MD  HYDROcodone-acetaminophen (NORCO/VICODIN) 5-325 MG per tablet Take 1 tablet by mouth every 4 (four) hours as needed. 09/29/14   Gwenette Wellons Orlene OchM Aibhlinn Kalmar, NP  metroNIDAZOLE (FLAGYL) 500 MG tablet Take 1 tablet (500 mg total) by mouth 2 (two) times daily. 09/29/14   Raelene Trew Orlene OchM Braylen Denunzio, NP  promethazine (PHENERGAN) 12.5 MG tablet Take 1 tablet (12.5 mg total) by mouth every 6 (six) hours as needed for nausea or vomiting. 09/29/14   Verneda Hollopeter Orlene OchM Geoffery Aultman, NP   BP 116/64 mmHg  Pulse 67  Temp(Src) 97.6 F (36.4 C) (Oral)  Resp 16  Ht  5' (1.524 m)  Wt 116 lb 4.8 oz (52.753 kg)  BMI 22.71 kg/m2  SpO2 100%  LMP 07/21/2014 Physical Exam  Constitutional: She is oriented to person, place, and time. She appears well-developed and well-nourished. No distress.  HENT:  Head: Normocephalic.  Eyes: EOM are normal.  Neck: Neck supple.  Cardiovascular: Normal rate.   Pulmonary/Chest: Effort normal.  Abdominal: Soft. There is tenderness in the right lower quadrant, suprapubic area and left lower quadrant. There is no rebound and no CVA tenderness.  Genitourinary:  External genitalia with vesicular lesion inside the right labia.  Blood tinged mucopurulent discharge vaginal vault. Positive CMT, left adnexal tenderness>right adnexal tenderness. Uterus without palpable enlargement.   Musculoskeletal: Normal range of motion.  Neurological: She is alert and oriented to person, place, and time. No cranial nerve deficit.  Skin: Skin is warm and dry.  Psychiatric: She has a normal mood and affect. Her behavior is normal.  Nursing note and vitals reviewed.   ED Course  Procedures (including critical care time) Labs, ultrasound, pain management, Rocephin 1 gram IM, Zithromax 1 gram PO  Results for orders placed or performed during the hospital encounter of 09/29/14 (from the past 24 hour(s))  Wet prep, genital     Status: Abnormal   Collection Time: 09/29/14  3:50 PM  Result Value Ref Range   Yeast Wet Prep HPF POC NONE SEEN NONE SEEN   Trich, Wet Prep FEW (A) NONE SEEN   Clue Cells Wet Prep HPF POC FEW (A) NONE SEEN   WBC, Wet Prep HPF POC FEW (A) NONE SEEN  Urinalysis, Routine w reflex microscopic     Status: Abnormal   Collection Time: 09/29/14  4:00 PM  Result Value Ref Range   Color, Urine YELLOW YELLOW   APPearance CLEAR CLEAR   Specific Gravity, Urine 1.025 1.005 - 1.030   pH 6.0 5.0 - 8.0   Glucose, UA NEGATIVE NEGATIVE mg/dL   Hgb urine dipstick TRACE (A) NEGATIVE   Bilirubin Urine NEGATIVE NEGATIVE   Ketones, ur NEGATIVE NEGATIVE mg/dL   Protein, ur NEGATIVE NEGATIVE mg/dL   Urobilinogen, UA 0.2 0.0 - 1.0 mg/dL   Nitrite NEGATIVE NEGATIVE   Leukocytes, UA NEGATIVE NEGATIVE  Urine microscopic-add on     Status: None   Collection Time: 09/29/14  4:00 PM  Result Value Ref Range   Squamous Epithelial / LPF RARE RARE   WBC, UA 0-2 <3 WBC/hpf   RBC / HPF 0-2 <3 RBC/hpf   Bacteria, UA RARE RARE   Urine-Other TRICHOMONAS PRESENT   CBC with Differential/Platelet     Status: Abnormal   Collection Time: 09/29/14  4:13 PM  Result Value Ref Range   WBC 9.3 4.0 - 10.5 K/uL   RBC 4.49 3.87 - 5.11 MIL/uL    Hemoglobin 13.0 12.0 - 15.0 g/dL   HCT 16.141.0 09.636.0 - 04.546.0 %   MCV 91.3 78.0 - 100.0 fL   MCH 29.0 26.0 - 34.0 pg   MCHC 31.7 30.0 - 36.0 g/dL   RDW 40.912.6 81.111.5 - 91.415.5 %   Platelets 300 150 - 400 K/uL   Neutrophils Relative % 46 43 - 77 %   Neutro Abs 4.3 1.7 - 7.7 K/uL   Lymphocytes Relative 40 12 - 46 %   Lymphs Abs 3.7 0.7 - 4.0 K/uL   Monocytes Relative 6 3 - 12 %   Monocytes Absolute 0.5 0.1 - 1.0 K/uL   Eosinophils Relative 8 (H) 0 - 5 %  Eosinophils Absolute 0.7 0.0 - 0.7 K/uL   Basophils Relative 0 0 - 1 %   Basophils Absolute 0.0 0.0 - 0.1 K/uL  RPR     Status: None   Collection Time: 09/29/14  4:13 PM  Result Value Ref Range   RPR Ser Ql Non Reactive Non Reactive  HIV antibody     Status: None   Collection Time: 09/29/14  4:13 PM  Result Value Ref Range   HIV Screen 4th Generation wRfx Non Reactive Non Reactive  POC urine preg, ED (not at [facility])     Status: None   Collection Time: 09/29/14  4:13 PM  Result Value Ref Range   Preg Test, Ur NEGATIVE NEGATIVE     Imaging Review US Transvaginal Non-ob  09/29/2014   CLINICAL DATA:  Bilateral pelvic pain with blood-tinged discharge. History of STD.  EXAM: TRANSABDOMINAL AND TRANSVAGINAL ULTRASOUND OF PELVIS  TECHNIQUE: Both transabdominal and transvaginal ultrasound examinations of the pelvis were performed. Transabdominal technique was performed for global imaging of the pelvis including uterus, ovaries, adnexal regions, and pelvic cul-de-sac. It was necessary to proceed with endovaginal exam following the transabdominal exam to visualize the uterus and endometrium to best advantage.  COMPARISON:  None  FINDINGS: Uterus  Measurements: 7.0 x 2.6 x 4.4 cm. 7 mm hypoechoic structure within the left uterine body may reflect a subendometrial cyst or degenerating fibroid. No other myometrial abnormalities identified.  Endometrium  Thickness: 6 mm.  No focal abnormality visualized.  Right ovary  Measurements: 3.2 x 1.8 x 2.9 cm. Normal  appearance/no adnexal mass. Normal blood flow with color Doppler.  Left ovary  Measurements: 3.1 x 2.2 cm. Third dimension not recorded. Normal appearance/no adnexal mass. Normal blood flow with color Doppler.  Other findings  No free fluid.  IMPRESSION: 1. No acute or significant findings demonstrated. Examination is mildly limited by the patient's pain. 2. Small subendometrial cyst or degenerating uterine fibroid. 3. The ovaries appear normal.   Electronically Signed   By: Carey Bullocks M.D.   On: 09/29/2014 17:44   US Pelvis Complete  09/29/2014   CLINICAL DATA:  Bilateral pelvic pain with blood-tinged discharge. History of STD.  EXAM: TRANSABDOMINAL AND TRANSVAGINAL ULTRASOUND OF PELVIS  TECHNIQUE: Both transabdominal and transvaginal ultrasound examinations of the pelvis were performed. Transabdominal technique was performed for global imaging of the pelvis including uterus, ovaries, adnexal regions, and pelvic cul-de-sac. It was necessary to proceed with endovaginal exam following the transabdominal exam to visualize the uterus and endometrium to best advantage.  COMPARISON:  None  FINDINGS: Uterus  Measurements: 7.0 x 2.6 x 4.4 cm. 7 mm hypoechoic structure within the left uterine body may reflect a subendometrial cyst or degenerating fibroid. No other myometrial abnormalities identified.  Endometrium  Thickness: 6 mm.  No focal abnormality visualized.  Right ovary  Measurements: 3.2 x 1.8 x 2.9 cm. Normal appearance/no adnexal mass. Normal blood flow with color Doppler.  Left ovary  Measurements: 3.1 x 2.2 cm. Third dimension not recorded. Normal appearance/no adnexal mass. Normal blood flow with color Doppler.  Other findings  No free fluid.  IMPRESSION: 1. No acute or significant findings demonstrated. Examination is mildly limited by the patient's pain. 2. Small subendometrial cyst or degenerating uterine fibroid. 3. The ovaries appear normal.   Electronically Signed   By: Carey Bullocks M.D.   On:  09/29/2014 17:44   Discussed with Dr. Emelda Fear and he will see the patient for follow up in 2 days.  MDM  24 y.o. female with pelvic pain, vaginal d/c and genital lesion that started 2 weeks ago and has gotten worse. Will treat as PID and she will follow up with Dr. Emelda Fear 10/01/14 @ 8:30 am. Rocephin, Zithromax given in the ED. Rx for Flagyl, Vicodin and Phenergan. Discussed with the patient and all questioned fully answered. She will return if any problems arise.   Final diagnoses:  Pelvic pain in female  PID (acute pelvic inflammatory disease)  Trichomonas vaginitis       Meno, NP 09/30/14 1610  Raeford Razor, MD 10/02/14 1806

## 2014-09-29 NOTE — ED Notes (Signed)
Pt reports lower abdominal pain, blood tinged vaginal discharge, "herpes outbreak" x2 weeks. Pt denies any n/v/d.

## 2014-09-30 LAB — RPR: RPR Ser Ql: NONREACTIVE

## 2014-09-30 LAB — HIV ANTIBODY (ROUTINE TESTING W REFLEX): HIV Screen 4th Generation wRfx: NONREACTIVE

## 2014-09-30 LAB — GC/CHLAMYDIA PROBE AMP (~~LOC~~) NOT AT ARMC
Chlamydia: NEGATIVE
NEISSERIA GONORRHEA: NEGATIVE

## 2014-10-01 ENCOUNTER — Ambulatory Visit: Payer: Medicaid Other | Admitting: Obstetrics and Gynecology

## 2014-10-01 ENCOUNTER — Encounter: Payer: Self-pay | Admitting: *Deleted

## 2014-10-01 LAB — HERPES SIMPLEX VIRUS CULTURE: CULTURE: NOT DETECTED

## 2014-10-04 ENCOUNTER — Ambulatory Visit: Payer: Medicaid Other | Admitting: Obstetrics and Gynecology

## 2014-12-29 ENCOUNTER — Encounter: Payer: Medicaid Other | Admitting: Women's Health

## 2014-12-29 NOTE — Progress Notes (Signed)
  This encounter was created in error - please disregard. Pt was rescheduled d/t insurance, encounter was opened in error

## 2014-12-29 NOTE — Progress Notes (Signed)
Patient ID: Mary [name], female   DOB: Sep 26, 1990, 24 y.o.   MRN: [id number] Pt had an appt today with Joellyn Haff, CNM, due to insurance pt appt was rescheduled. Pt requesting a note stating she did have an appt in our office today for work. Note given.

## 2015-01-13 ENCOUNTER — Other Ambulatory Visit: Payer: Medicaid Other | Admitting: Adult Health

## 2015-01-13 ENCOUNTER — Encounter: Payer: Self-pay | Admitting: Adult Health

## 2015-01-25 ENCOUNTER — Other Ambulatory Visit (HOSPITAL_COMMUNITY)
Admission: RE | Admit: 2015-01-25 | Discharge: 2015-01-25 | Disposition: A | Discharge status: Home / Self Care | Payer: Medicaid Other | Source: Ambulatory Visit | Attending: Obstetrics & Gynecology | Admitting: Obstetrics & Gynecology

## 2015-01-25 ENCOUNTER — Ambulatory Visit (INDEPENDENT_AMBULATORY_CARE_PROVIDER_SITE_OTHER): Payer: Medicaid Other | Admitting: Adult Health

## 2015-01-25 ENCOUNTER — Encounter: Payer: Self-pay | Admitting: Adult Health

## 2015-01-25 VITALS — BP 108/60 | HR 84 | Ht 60.75 in | Wt 108.5 lb

## 2015-01-25 DIAGNOSIS — N926 Irregular menstruation, unspecified: Secondary | ICD-10-CM

## 2015-01-25 DIAGNOSIS — Z01411 Encounter for gynecological examination (general) (routine) with abnormal findings: Secondary | ICD-10-CM | POA: Insufficient documentation

## 2015-01-25 DIAGNOSIS — R300 Dysuria: Secondary | ICD-10-CM

## 2015-01-25 DIAGNOSIS — Z3009 Encounter for other general counseling and advice on contraception: Secondary | ICD-10-CM | POA: Diagnosis not present

## 2015-01-25 DIAGNOSIS — Z113 Encounter for screening for infections with a predominantly sexual mode of transmission: Secondary | ICD-10-CM | POA: Insufficient documentation

## 2015-01-25 DIAGNOSIS — B009 Herpesviral infection, unspecified: Secondary | ICD-10-CM | POA: Insufficient documentation

## 2015-01-25 DIAGNOSIS — Z3202 Encounter for pregnancy test, result negative: Secondary | ICD-10-CM

## 2015-01-25 DIAGNOSIS — Z01419 Encounter for gynecological examination (general) (routine) without abnormal findings: Secondary | ICD-10-CM

## 2015-01-25 DIAGNOSIS — R109 Unspecified abdominal pain: Secondary | ICD-10-CM | POA: Insufficient documentation

## 2015-01-25 HISTORY — DX: Unspecified abdominal pain: R10.9

## 2015-01-25 HISTORY — DX: Irregular menstruation, unspecified: N92.6

## 2015-01-25 LAB — POCT URINALYSIS DIPSTICK
Glucose, UA: NEGATIVE
Leukocytes, UA: NEGATIVE
NITRITE UA: NEGATIVE
Protein, UA: NEGATIVE
RBC UA: NEGATIVE

## 2015-01-25 LAB — POCT URINE PREGNANCY: PREG TEST UR: NEGATIVE

## 2015-01-25 MED ORDER — VALACYCLOVIR HCL 1 G PO TABS: ORAL_TABLET | ORAL | Status: AC

## 2015-01-25 MED ORDER — MEDROXYPROGESTERONE ACETATE 10 MG PO TABS: 10.0000 mg | ORAL_TABLET | Freq: Every day | ORAL | Status: DC

## 2015-01-25 MED ORDER — NAPROXEN SODIUM 550 MG PO TABS: 550.0000 mg | ORAL_TABLET | Freq: Two times a day (BID) | ORAL | Status: DC

## 2015-01-25 NOTE — Progress Notes (Signed)
Patient ID: Mary [name], female   DOB: April 13, 1991, 24 y.o.   MRN: [id number] History of Present Illness: Mary [name] is a 24 year old white female, single in for well woman gyn exam and pap and is complaining of burning with urination and pain in vagina and pain with sex.She has not had a period in months.She has cramps,feels like razor blades when wipes. She was sen in ER in May for pelvic pain.   Current Medications, Allergies, Past Medical History, Past Surgical History, Family History and Social History were reviewed in [employer] record.     Review of Systems: Patient denies any headaches, hearing loss, fatigue, blurred vision, shortness of breath, chest pain, problems with bowel movements. No joint pain or mood swings. See HPI for positives.   Physical Exam:BP 108/60 mmHg  Pulse 84  Ht 5' 0.75" (1.543 m)  Wt 108 lb 8 oz (49.215 kg)  BMI 20.67 kg/m2 UPT negative, urine dipstick negative. General:  Well developed, well nourished, no acute distress Skin:  Warm and dry Neck:  Midline trachea, normal thyroid, good ROM, no lymphadenopathy Lungs; Clear to auscultation bilaterally Breast:  No dominant palpable mass, retraction, or nipple discharge Cardiovascular: Regular rate and rhythm Abdomen:  Soft, non tender, no hepatosplenomegaly Pelvic:  External genitalia is normal in appearance, has vesicle to left of clitoris and it is slight swollen and tender.  The vagina is normal in appearance. Urethra has no lesions or masses. The cervix is smooth, pap with GC/CHL performed.Marland Kitchen  Uterus is felt to be normal size, shape, and contour.  No adnexal masses, LLQ tenderness noted.Bladder is non tender, no masses felt. Extremities/musculoskeletal:  No swelling or varicosities noted, no clubbing or cyanosis Psych:  No mood changes, alert and cooperative,seems happy Discussed herpes and shedding.   Impression: Well woman gyn exam with pap and GC/CHL family planning  medicaid Herpes Burning with urination Missed periods Cramps     Plan: HIV and RPR Rx valtrex 1 gm 1 bid x 5 days then 1 bid #35 with 11 refills Rx provera 10 mg #10 take 1 daily x 10 days  Rx anaprox ds #30 1 bid prn pain with 2 refills Follow up in 2 weeks, no sex til then Check on getting regular medicaid or [facility] discount  Physical in 1 year

## 2015-01-25 NOTE — Patient Instructions (Signed)
Genital Herpes Genital herpes is a sexually transmitted disease. This means that it is a disease passed by having sex with an infected person. There is no cure for genital herpes. The time between attacks can be months to years. The virus may live in a person but produce no problems (symptoms). This infection can be passed to a baby as it travels down the birth canal (vagina). In a newborn, this can cause central nervous system damage, eye damage, or even death. The virus that causes genital herpes is usually HSV-2 virus. The virus that causes oral herpes is usually HSV-1. The diagnosis (learning what is wrong) is made through culture results. SYMPTOMS  Usually symptoms of pain and itching begin a few days to a week after contact. It first appears as small blisters that progress to small painful ulcers which then scab over and heal after several days. It affects the outer genitalia, birth canal, cervix, penis, anal area, buttocks, and thighs. HOME CARE INSTRUCTIONS   Keep ulcerated areas dry and clean.  Take medications as directed. Antiviral medications can speed up healing. They will not prevent recurrences or cure this infection. These medications can also be taken for suppression if there are frequent recurrences.  While the infection is active, it is contagious. Avoid all sexual contact during active infections.  Condoms may help prevent spread of the herpes virus.  Practice safe sex.  Wash your hands thoroughly after touching the genital area.  Avoid touching your eyes after touching your genital area.  Inform your caregiver if you have had genital herpes and become pregnant. It is your responsibility to insure a safe outcome for your baby in this pregnancy.  Only take over-the-counter or prescription medicines for pain, discomfort, or fever as directed by your caregiver. SEEK MEDICAL CARE IF:   You have a recurrence of this infection.  You do not respond to medications and are not  improving.  You have new sources of pain or discharge which have changed from the original infection.  You have an oral temperature above 102 F (38.9 C).  You develop abdominal pain.  You develop eye pain or signs of eye infection. Document Released: 05/11/2000 Document Revised: 08/06/2011 Document Reviewed: 06/01/2009 [facility] Patient Information 2015 [place], [place]. This information is not intended to replace advice given to you by your health care provider. Make sure you discuss any questions you have with your health care provider. Take provera 10 mg 1 daily x 10days Take valtrex Follow up in 2 weeks

## 2015-01-26 LAB — RPR: RPR Ser Ql: NONREACTIVE

## 2015-01-26 LAB — HIV ANTIBODY (ROUTINE TESTING W REFLEX): HIV Screen 4th Generation wRfx: NONREACTIVE

## 2015-01-27 LAB — CYTOLOGY - PAP

## 2015-02-08 ENCOUNTER — Ambulatory Visit: Payer: Medicaid Other | Admitting: Adult Health

## 2015-06-12 ENCOUNTER — Encounter (HOSPITAL_COMMUNITY): Payer: Self-pay | Admitting: *Deleted

## 2015-06-12 ENCOUNTER — Emergency Department (HOSPITAL_COMMUNITY)
Admission: EM | Admit: 2015-06-12 | Discharge: 2015-06-12 | Disposition: A | Discharge status: Home / Self Care | Payer: Medicaid Other | Attending: Emergency Medicine | Admitting: Emergency Medicine

## 2015-06-12 DIAGNOSIS — F419 Anxiety disorder, unspecified: Secondary | ICD-10-CM | POA: Insufficient documentation

## 2015-06-12 DIAGNOSIS — J45901 Unspecified asthma with (acute) exacerbation: Secondary | ICD-10-CM | POA: Insufficient documentation

## 2015-06-12 DIAGNOSIS — Z791 Long term (current) use of non-steroidal anti-inflammatories (NSAID): Secondary | ICD-10-CM | POA: Insufficient documentation

## 2015-06-12 DIAGNOSIS — F1120 Opioid dependence, uncomplicated: Secondary | ICD-10-CM

## 2015-06-12 DIAGNOSIS — F329 Major depressive disorder, single episode, unspecified: Secondary | ICD-10-CM | POA: Insufficient documentation

## 2015-06-12 DIAGNOSIS — K589 Irritable bowel syndrome without diarrhea: Secondary | ICD-10-CM | POA: Insufficient documentation

## 2015-06-12 DIAGNOSIS — Z79899 Other long term (current) drug therapy: Secondary | ICD-10-CM | POA: Insufficient documentation

## 2015-06-12 DIAGNOSIS — Z8742 Personal history of other diseases of the female genital tract: Secondary | ICD-10-CM | POA: Insufficient documentation

## 2015-06-12 DIAGNOSIS — F1123 Opioid dependence with withdrawal: Secondary | ICD-10-CM | POA: Insufficient documentation

## 2015-06-12 DIAGNOSIS — F17210 Nicotine dependence, cigarettes, uncomplicated: Secondary | ICD-10-CM | POA: Insufficient documentation

## 2015-06-12 DIAGNOSIS — Z8619 Personal history of other infectious and parasitic diseases: Secondary | ICD-10-CM | POA: Insufficient documentation

## 2015-06-12 DIAGNOSIS — Z3202 Encounter for pregnancy test, result negative: Secondary | ICD-10-CM | POA: Insufficient documentation

## 2015-06-12 DIAGNOSIS — F1193 Opioid use, unspecified with withdrawal: Secondary | ICD-10-CM

## 2015-06-12 DIAGNOSIS — R112 Nausea with vomiting, unspecified: Secondary | ICD-10-CM

## 2015-06-12 DIAGNOSIS — R636 Underweight: Secondary | ICD-10-CM | POA: Insufficient documentation

## 2015-06-12 LAB — COMPREHENSIVE METABOLIC PANEL
ALK PHOS: 99 U/L (ref 38–126)
ALT: 24 U/L (ref 14–54)
ANION GAP: 16 — AB (ref 5–15)
AST: 29 U/L (ref 15–41)
Albumin: 5.4 g/dL — ABNORMAL HIGH (ref 3.5–5.0)
BILIRUBIN TOTAL: 0.8 mg/dL (ref 0.3–1.2)
BUN: 18 mg/dL (ref 6–20)
CALCIUM: 10.9 mg/dL — AB (ref 8.9–10.3)
CO2: 28 mmol/L (ref 22–32)
Chloride: 94 mmol/L — ABNORMAL LOW (ref 101–111)
Creatinine, Ser: 0.92 mg/dL (ref 0.44–1.00)
GFR calc non Af Amer: >60 mL/min (ref >60)
Glucose, Bld: 103 mg/dL — ABNORMAL HIGH (ref 65–99)
Potassium: 3.8 mmol/L (ref 3.5–5.1)
Sodium: 138 mmol/L (ref 135–145)
TOTAL PROTEIN: 9 g/dL — AB (ref 6.5–8.1)

## 2015-06-12 LAB — CBC WITH DIFFERENTIAL/PLATELET
BASOS PCT: 0 %
Basophils Absolute: 0 10*3/uL (ref 0.0–0.1)
EOS ABS: 0 10*3/uL (ref 0.0–0.7)
Eosinophils Relative: 0 %
HCT: 40.7 % (ref 36.0–46.0)
Hemoglobin: 13.3 g/dL (ref 12.0–15.0)
LYMPHS ABS: 3.2 10*3/uL (ref 0.7–4.0)
Lymphocytes Relative: 22 %
MCH: 29.6 pg (ref 26.0–34.0)
MCHC: 32.7 g/dL (ref 30.0–36.0)
MCV: 90.6 fL (ref 78.0–100.0)
MONO ABS: 1.2 10*3/uL — AB (ref 0.1–1.0)
MONOS PCT: 8 %
Neutro Abs: 10.1 10*3/uL — ABNORMAL HIGH (ref 1.7–7.7)
Neutrophils Relative %: 70 %
Platelets: 302 10*3/uL (ref 150–400)
RBC: 4.49 MIL/uL (ref 3.87–5.11)
RDW: 12.9 % (ref 11.5–15.5)
WBC: 14.6 10*3/uL — ABNORMAL HIGH (ref 4.0–10.5)

## 2015-06-12 LAB — URINALYSIS, ROUTINE W REFLEX MICROSCOPIC
Bilirubin Urine: NEGATIVE
Glucose, UA: NEGATIVE mg/dL
HGB URINE DIPSTICK: NEGATIVE
KETONES UR: 15 mg/dL — AB
Leukocytes, UA: NEGATIVE
Nitrite: NEGATIVE
PH: 7 (ref 5.0–8.0)
Protein, ur: NEGATIVE mg/dL

## 2015-06-12 LAB — PREGNANCY, URINE: Preg Test, Ur: NEGATIVE

## 2015-06-12 MED ORDER — CLONIDINE HCL 0.1 MG PO TABS
0.1000 mg | ORAL_TABLET | Freq: Four times a day (QID) | ORAL | Status: DC
  Administered 2015-06-12: 0.1 mg via ORAL
  Filled 2015-06-12: qty 1

## 2015-06-12 MED ORDER — SODIUM CHLORIDE 0.9 % IV SOLN
1000.0000 mL | Freq: Once | INTRAVENOUS | Status: AC
  Administered 2015-06-12: 1000 mL via INTRAVENOUS

## 2015-06-12 MED ORDER — ONDANSETRON HCL 4 MG/2ML IJ SOLN
4.0000 mg | Freq: Once | INTRAMUSCULAR | Status: AC
  Administered 2015-06-12: 4 mg via INTRAVENOUS
  Filled 2015-06-12: qty 2

## 2015-06-12 MED ORDER — METHOCARBAMOL 500 MG PO TABS
1000.0000 mg | ORAL_TABLET | Freq: Four times a day (QID) | ORAL | Status: DC | PRN
  Administered 2015-06-12: 1000 mg via ORAL
  Filled 2015-06-12: qty 2

## 2015-06-12 MED ORDER — SODIUM CHLORIDE 0.9 % IV SOLN: 1000.0000 mL | Freq: Once | INTRAVENOUS | Status: DC

## 2015-06-12 MED ORDER — HYDROXYZINE HCL 25 MG PO TABS: 50.0000 mg | ORAL_TABLET | Freq: Four times a day (QID) | ORAL | Status: DC | PRN

## 2015-06-12 MED ORDER — CLONIDINE HCL 0.1 MG PO TABS: 0.1000 mg | ORAL_TABLET | Freq: Every day | ORAL | Status: DC

## 2015-06-12 MED ORDER — DICYCLOMINE HCL 20 MG PO TABS: 20.0000 mg | ORAL_TABLET | Freq: Four times a day (QID) | ORAL | Status: DC | PRN

## 2015-06-12 MED ORDER — LOPERAMIDE HCL 2 MG PO CAPS: 2.0000 mg | ORAL_CAPSULE | ORAL | Status: DC | PRN

## 2015-06-12 MED ORDER — SODIUM CHLORIDE 0.9 % IV SOLN: 1000.0000 mL | INTRAVENOUS | Status: DC

## 2015-06-12 MED ORDER — DICYCLOMINE HCL 10 MG/ML IM SOLN
20.0000 mg | Freq: Four times a day (QID) | INTRAMUSCULAR | Status: DC | PRN
  Administered 2015-06-12: 20 mg via INTRAMUSCULAR
  Filled 2015-06-12: qty 2

## 2015-06-12 MED ORDER — NAPROXEN 250 MG PO TABS: 500.0000 mg | ORAL_TABLET | Freq: Two times a day (BID) | ORAL | Status: DC | PRN

## 2015-06-12 MED ORDER — CLONIDINE HCL 0.1 MG PO TABS: 0.1000 mg | ORAL_TABLET | ORAL | Status: DC

## 2015-06-12 MED ORDER — SODIUM CHLORIDE 0.9 % IV BOLUS (SEPSIS)
1000.0000 mL | Freq: Once | INTRAVENOUS | Status: AC
  Administered 2015-06-12: 1000 mL via INTRAVENOUS

## 2015-06-12 MED ORDER — METHOCARBAMOL 500 MG PO TABS: ORAL_TABLET | ORAL | Status: DC

## 2015-06-12 NOTE — Discharge Instructions (Signed)
Drink plenty of fluids. Continue the Librium, Phenergan and ibuprofen for a trial symptoms. Consider adding Bentyl for abdominal cramps and nausea and Robaxin for muscle spasms. Sometimes but the force of vomiting there will be rupture of small capillary blood vessels in usual sees small amounts of blood in the vomitus. Return to the ED however if you or vomiting just pure blood.   Nausea and Vomiting Nausea means you feel sick to your stomach. Throwing up (vomiting) is a reflex where stomach contents come out of your mouth. HOME CARE   Take medicine as told by your doctor.  Do not force yourself to eat. However, you do need to drink fluids.  If you feel like eating, eat a normal diet as told by your doctor.  Eat rice, wheat, potatoes, bread, lean meats, yogurt, fruits, and vegetables.  Avoid high-fat foods.  Drink enough fluids to keep your pee (urine) clear or pale yellow.  Ask your doctor how to replace body fluid losses (rehydrate). Signs of body fluid loss (dehydration) include:  Feeling very thirsty.  Dry lips and mouth.  Feeling dizzy.  Dark pee.  Peeing less than normal.  Feeling confused.  Fast breathing or heart rate. GET HELP RIGHT AWAY IF:   You have blood in your throw up.  You have black or bloody poop (stool).  You have a bad headache or stiff neck.  You feel confused.  You have bad belly (abdominal) pain.  You have chest pain or trouble breathing.  You do not pee at least once every 8 hours.  You have cold, clammy skin.  You keep throwing up after 24 to 48 hours.  You have a fever. MAKE SURE YOU:   Understand these instructions.  Will watch your condition.  Will get help right away if you are not doing well or get worse.   This information is not intended to replace advice given to you by your health care provider. Make sure you discuss any questions you have with your health care provider.   Document Released: 10/31/2007 Document  Revised: 08/06/2011 Document Reviewed: 10/13/2010 Elsevier Interactive Patient Education 2016 [employer]  Opioid Withdrawal Opioids are a group of narcotic drugs. They include the street drug heroin. They also include pain medicines, such as morphine, hydrocodone, oxycodone, and fentanyl. Opioid withdrawal is a group of characteristic physical and mental signs and symptoms. It typically occurs if you have been using opioids daily for several weeks or longer and stop using or rapidly decrease use. Opioid withdrawal can also occur if you have used opioids daily for a long time and are given a medicine to block the effect.  SIGNS AND SYMPTOMS Opioid withdrawal includes three or more of the following symptoms:   Depressed, anxious, or irritable mood.  Nausea or vomiting.  Muscle aches or spasms.   Watery eyes.   Runny nose.  Dilated pupils, sweating, or hairs standing on end.  Diarrhea or intestinal cramping.  Yawning.   Fever.  Increased blood pressure.  Fast pulse.  Restlessness or trouble sleeping. These signs and symptoms occur within several hours of stopping or reducing short-acting opioids, such as heroin. They can occur within 3 days of stopping or reducing long-acting opioids, such as methadone. Withdrawal begins within minutes of receiving a drug that blocks the effects of opioids, such as naltrexone or naloxone. DIAGNOSIS  Opioid use disorder is diagnosed by your health care provider. You will be asked about your symptoms, drug and alcohol use, medical history, and  use of medicines. A physical exam may be done. Lab tests may be ordered. Your health care provider may have you see a mental health professional.  TREATMENT  The treatment for opioid withdrawal is usually provided by medical doctors with special training in substance use disorders (addiction specialists). The following medicines may be included in treatment:  Opioids given in place of the abused opioid.  They turn on opioid receptors in the brain and lessen or prevent withdrawal symptoms. They are gradually decreased (opioid substitution and taper).  Non-opioids that can lessen certain opioid withdrawal symptoms. They may be used alone or with opioid substitution and taper. Successful long-term recovery usually requires medicine, counseling, and group support. HOME CARE INSTRUCTIONS   Take medicines only as directed by your health care provider.  Check with your health care provider before starting new medicines.  Keep all follow-up visits as directed by your health care provider. SEEK MEDICAL CARE IF:  You are not able to take your medicines as directed.  Your symptoms get worse.  You relapse. SEEK IMMEDIATE MEDICAL CARE IF:  You have serious thoughts about hurting yourself or others.  You have a seizure.  You lose consciousness.   This information is not intended to replace advice given to you by your health care provider. Make sure you discuss any questions you have with your health care provider.   Document Released: 05/17/2003 Document Revised: 06/04/2014 Document Reviewed: 05/27/2013 Elsevier Interactive Patient Education [employer]

## 2015-06-12 NOTE — ED Notes (Signed)
Had to shake pt multiple times and remove covers and raise voice to get pt to wake for discharge.  Discharge papers given to pt and police custody.  Pt states understanding to care given

## 2015-06-12 NOTE — ED Notes (Signed)
[facility]  Per pt, she began vomiting this morning, and emesis became bloody tonight.  Pt had small amount of blood tinged emesis in container upon arrival.  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  Pt has been receiving librium, phenergan and ibuprofen.

## 2015-06-12 NOTE — ED Provider Notes (Signed)
CSN: [id number]     Arrival date & time 06/12/15  0051 History   First MD Initiated Contact with Patient 06/12/15 0140     Chief Complaint  Patient presents with  . Hematemesis     (Consider location/radiation/quality/duration/timing/severity/associated sxs/prior Treatment) HPI patient reports a history of narcotic addiction. She states she was placed in jail on the 12th and almost weekly started having withdrawal symptoms. She has been placed on Librium, Phenergan, and ibuprofen in the jail. However she reports she is feeling very jittery with restless legs, nausea and vomiting that started today, abdominal cramping, mild diarrhea, panic attacks and feeling short of breath, states she feels weak and has a headache. States the last time she detoxes when and she was in jail year ago. It seems on talking to her she only detoxes when she is placed in jail. She states she had considered going to methadone clinic when with her first parole however she did not do that. With her vomiting tonight she thought she was seeing some blood.  Patient states she does heroin daily for the past 2 weeks about twice a day which costs about $60, she takes hydromorphone 60 mg in the morning 8 mg in the afternoon and 60 mg at night and she has been doing that 1-2 years, she takes morphine 160 mg twice a day and she has done that for about 3-4 years, and she takes Xanax when she can get her narcotics and she will take 6-10 mg a day. She states she is depressed only when she is in jail. She states she's on probation for larceny, but denies she failed drug test with her parole officer.   PCP Dr Janna Arch  Past Medical History  Diagnosis Date  . Asthma   . Chlamydia   . Depression   . Migraine   . Back pain   . IBS (irritable bowel syndrome)   . Trichomonal vaginitis 06/04/2013  . Lymph nodes enlarged 06/04/2013    Bilateral in groin L>R +trich  . Dysmenorrhea 06/04/2013  . Herpes   . Missed periods 01/25/2015  .  Abdominal cramps 01/25/2015   Past Surgical History  Procedure Laterality Date  . Tonsillectomy     Family History  Problem Relation Age of Onset  . Depression Mother   . Anxiety disorder Mother   . Depression Father   . Alcohol abuse Father   . Other Sister     was in MVA  . Anxiety disorder Sister   . Other Brother     heart condition  . Depression Brother   . Anxiety disorder Brother   . Depression Sister   . Anxiety disorder Sister   . Depression Sister   . Anxiety disorder Sister   . Depression Brother   . Anxiety disorder Brother   . Obesity Brother   . Depression Brother   . Anxiety disorder Brother   . Depression Brother   . Anxiety disorder Brother   . Depression Brother   . Anxiety disorder Brother   . Depression Brother   . Anxiety disorder Brother   . Depression Brother   . Anxiety disorder Brother   . Depression Brother   . Anxiety disorder Brother   . Other Paternal Grandmother     heart stopped  . Dementia Maternal Grandmother   . Heart disease Maternal Grandmother   . Hypertension Maternal Grandmother   . Other Brother     was in MVA   Social History  Substance  Use Topics  . Smoking status: Current Every Day Smoker -- 1.00 packs/day for 7 years    Types: Cigarettes  . Smokeless tobacco: Never Used  . 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     Allergies  Hydrocodone; Raspberry; Tramadol; and Other  Home Medications   Prior to Admission medications   Medication Sig Start Date End Date Taking? Authorizing Provider  albuterol (PROVENTIL HFA;VENTOLIN HFA) 108 (90 BASE) MCG/ACT inhaler Inhale 2 puffs into the lungs every 4 (four) hours as needed for wheezing or shortness of breath. Patient not  taking: Reported on 01/25/2015 07/12/13   Gilda Crease, MD  dicyclomine (BENTYL) 20 MG tablet Take 1 tablet (20 mg total) by mouth 4 (four) times daily as needed (abdominal cramping or vomiting). 06/12/15   Devoria Albe, MD  medroxyPROGESTERone (PROVERA) 10 MG tablet Take 1 tablet (10 mg total) by mouth daily. 01/25/15   [name], NP  methocarbamol (ROBAXIN) 500 MG tablet Take 1 or 2 po Q 6hrs for body aches or muscle spasms pain 06/12/15   Devoria Albe, MD  naproxen sodium (ANAPROX) 550 MG tablet Take 1 tablet (550 mg total) by mouth 2 (two) times daily with a meal. 01/25/15   [name], NP  promethazine (PHENERGAN) 12.5 MG tablet Take 1 tablet (12.5 mg total) by mouth every 6 (six) hours as needed for nausea or vomiting. 09/29/14   Hope Orlene Och, NP  valACYclovir (VALTREX) 1000 MG tablet Take 1 daily bid for 5 days then 1 daily 01/25/15   [name], NP   ED Triage Vitals  Enc Vitals Group     BP 06/12/15 0102 118/84 mmHg     Pulse Rate 06/12/15 0102 78     Resp 06/12/15 0102 18     Temp 06/12/15 0102 98.6 F (37 C)     Temp Source 06/12/15 0102 Oral     SpO2 06/12/15 0102 100 %     Weight 06/12/15 0102 103 lb (46.72 kg)     Height 06/12/15 0102 5' (1.524 m)     Head Cir --      Peak Flow --      Pain Score 06/12/15 0103 10     Pain Loc --      Pain Edu? --      Excl. in GC? --    Vital signs normal    Physical Exam  Constitutional: She is oriented to person, place, and time.  Non-toxic appearance. She does not appear ill. No distress.  Underweight female  HENT:  Head: Normocephalic and atraumatic.  Right Ear: External ear normal.  Left Ear: External ear normal.  Nose: Nose normal. No mucosal edema or rhinorrhea.  Mouth/Throat: Oropharynx is clear and moist and mucous membranes are normal. No dental abscesses or uvula swelling.  Poor dentition  Eyes: Conjunctivae and EOM are normal. Pupils are equal, round, and reactive to light.  Neck: Normal range of  motion and full passive range of motion without pain. Neck supple.  Cardiovascular: Normal rate, regular rhythm and normal heart sounds.  Exam reveals no gallop and no friction rub.  No murmur heard. Pulmonary/Chest: Effort normal and breath sounds normal. No respiratory distress. She has no wheezes. She has no rhonchi. She has no rales. She exhibits no tenderness and no crepitus.  Abdominal: Soft. Normal appearance and bowel sounds are normal. She exhibits no distension. There is no tenderness. There is no rebound and no guarding.  Musculoskeletal: Normal range of motion. She exhibits no edema or tenderness.  Moves all extremities well.   Neurological: She is alert and oriented to person, place, and time. She has normal strength. No cranial nerve deficit.  Skin: Skin is warm, dry and intact. No rash noted. No erythema. No pallor.  Psychiatric: Her mood appears anxious. Her speech is rapid and/or pressured. She is agitated.  Patient is shaking. She seems anxious.  Nursing note and vitals reviewed.   ED Course  Procedures (including critical care time)  Medications  hydrOXYzine (ATARAX/VISTARIL) tablet 50 mg (not administered)  loperamide (IMODIUM) capsule 2-4 mg (not administered)  methocarbamol (ROBAXIN) tablet 1,000 mg (1,000 mg Oral Given 06/12/15 0238)  naproxen (NAPROSYN) tablet 500 mg (not administered)  cloNIDine (CATAPRES) tablet 0.1 mg (0.1 mg Oral Given 06/12/15 0238)    Followed by  cloNIDine (CATAPRES) tablet 0.1 mg (not administered)    Followed by  cloNIDine (CATAPRES) tablet 0.1 mg (not administered)  dicyclomine (BENTYL) injection 20 mg (20 mg Intramuscular Given 06/12/15 0205)  sodium chloride 0.9 % bolus 1,000 mL (0 mLs Intravenous Stopped 06/12/15 0213)  0.9 %  sodium chloride infusion (0 mLs Intravenous Stopped 06/12/15 0323)  ondansetron (ZOFRAN) injection 4 mg (4 mg Intravenous Given 06/12/15 0204)   Patient started on the clonidine narcotic withdrawal protocol. She  was given IV fluids. She is aware she is not getting any narcotic medications while in the ED.  [place] shows she has not been prescribed narcotics in the past 6 months.  Patient was awakened at about 4:15 AM. She states she's feeling better. The officer's report she has been ambulatory to the bathroom. We will repeat her narcotic withdrawal score however she will probably be able to be discharged soon.  Patient's initial clinical opiate  withdrawal score was 26, at the time of discharge it was 4.  Patient was felt to be stable for discharge.    Labs Review Results for orders placed or performed during the hospital encounter of 06/12/15  Pregnancy, urine  Result Value Ref Range   Preg Test, Ur NEGATIVE NEGATIVE  Comprehensive metabolic panel  Result Value Ref Range   Sodium 138 135 - 145 mmol/L   Potassium 3.8 3.5 - 5.1 mmol/L   Chloride 94 (L) 101 - 111 mmol/L   CO2 28 22 - 32 mmol/L   Glucose, Bld 103 (H) 65 - 99 mg/dL   BUN 18 6 - 20 mg/dL   Creatinine, Ser 4.090.92 0.44 - 1.00 mg/dL   Calcium 81.110.9 (H) 8.9 - 10.3 mg/dL   Total Protein 9.0 (H) 6.5 - 8.1 g/dL   Albumin 5.4 (H) 3.5 - 5.0 g/dL   AST 29 15 - 41 U/L   ALT 24 14 - 54 U/L   Alkaline Phosphatase 99 38 - 126 U/L   Total Bilirubin 0.8 0.3 - 1.2 mg/dL   GFR calc non Af Amer >60 >60 mL/min   GFR calc Af Amer >60 >60 mL/min   Anion gap 16 (H) 5 - 15  Urinalysis, Routine w reflex microscopic  Result Value Ref Range   Color, Urine YELLOW YELLOW  APPearance CLEAR CLEAR   Specific Gravity, Urine <1.005 (L) 1.005 - 1.030   pH 7.0 5.0 - 8.0   Glucose, UA NEGATIVE NEGATIVE mg/dL   Hgb urine dipstick NEGATIVE NEGATIVE   Bilirubin Urine NEGATIVE NEGATIVE   Ketones, ur 15 (A) NEGATIVE mg/dL   Protein, ur NEGATIVE NEGATIVE mg/dL   Nitrite NEGATIVE NEGATIVE   Leukocytes, UA NEGATIVE NEGATIVE  CBC with Differential  Result Value Ref Range   WBC 14.6 (H) 4.0 - 10.5 K/uL   RBC 4.49 3.87 - 5.11 MIL/uL    Hemoglobin 13.3 12.0 - 15.0 g/dL   HCT 16.1 09.6 - 04.5 %   MCV 90.6 78.0 - 100.0 fL   MCH 29.6 26.0 - 34.0 pg   MCHC 32.7 30.0 - 36.0 g/dL   RDW 40.9 81.1 - 91.4 %   Platelets 302 150 - 400 K/uL   Neutrophils Relative % 70 %   Neutro Abs 10.1 (H) 1.7 - 7.7 K/uL   Lymphocytes Relative 22 %   Lymphs Abs 3.2 0.7 - 4.0 K/uL   Monocytes Relative 8 %   Monocytes Absolute 1.2 (H) 0.1 - 1.0 K/uL   Eosinophils Relative 0 %   Eosinophils Absolute 0.0 0.0 - 0.7 K/uL   Basophils Relative 0 %   Basophils Absolute 0.0 0.0 - 0.1 K/uL   Laboratory interpretation all normal except except for leukocytosis     Imaging Review No results found. I have personally reviewed and evaluated these images and lab results as part of my medical decision-making.   EKG Interpretation None      MDM   Final diagnoses:  Acute narcotic withdrawal (HCC)  Narcotic addiction (HCC)  Nausea and vomiting, vomiting of unspecified type    New Prescriptions   DICYCLOMINE (BENTYL) 20 MG TABLET    Take 1 tablet (20 mg total) by mouth 4 (four) times daily as needed (abdominal cramping or vomiting).   METHOCARBAMOL (ROBAXIN) 500 MG TABLET    Take 1 or 2 po Q 6hrs for body aches or muscle spasms pain    Plan discharge  Devoria Albe, MD, Concha Pyo, MD 06/12/15 [phone]

## 2016-01-12 ENCOUNTER — Emergency Department (HOSPITAL_COMMUNITY)
Admission: EM | Admit: 2016-01-12 | Discharge: 2016-01-12 | Disposition: A | Discharge status: Home / Self Care | Payer: Self-pay | Attending: Emergency Medicine | Admitting: Emergency Medicine

## 2016-01-12 ENCOUNTER — Encounter (HOSPITAL_COMMUNITY): Payer: Self-pay

## 2016-01-12 ENCOUNTER — Emergency Department (HOSPITAL_COMMUNITY): Payer: Self-pay

## 2016-01-12 DIAGNOSIS — M5440 Lumbago with sciatica, unspecified side: Secondary | ICD-10-CM | POA: Insufficient documentation

## 2016-01-12 DIAGNOSIS — F17210 Nicotine dependence, cigarettes, uncomplicated: Secondary | ICD-10-CM | POA: Insufficient documentation

## 2016-01-12 DIAGNOSIS — M25551 Pain in right hip: Secondary | ICD-10-CM | POA: Insufficient documentation

## 2016-01-12 DIAGNOSIS — M5441 Lumbago with sciatica, right side: Secondary | ICD-10-CM

## 2016-01-12 DIAGNOSIS — J45909 Unspecified asthma, uncomplicated: Secondary | ICD-10-CM | POA: Insufficient documentation

## 2016-01-12 DIAGNOSIS — M5442 Lumbago with sciatica, left side: Secondary | ICD-10-CM

## 2016-01-12 LAB — POC URINE PREG, ED: Preg Test, Ur: NEGATIVE

## 2016-01-12 MED ORDER — ONDANSETRON 8 MG PO TBDP
8.0000 mg | ORAL_TABLET | Freq: Once | ORAL | Status: AC
  Administered 2016-01-12: 8 mg via ORAL
  Filled 2016-01-12: qty 1

## 2016-01-12 MED ORDER — HYDROMORPHONE HCL 1 MG/ML IJ SOLN
1.0000 mg | Freq: Once | INTRAMUSCULAR | Status: AC
  Administered 2016-01-12: 1 mg via INTRAMUSCULAR
  Filled 2016-01-12: qty 1

## 2016-01-12 MED ORDER — METHOCARBAMOL 500 MG PO TABS: 500.0000 mg | ORAL_TABLET | Freq: Four times a day (QID) | ORAL | 0 refills | Status: AC | PRN

## 2016-01-12 MED ORDER — MELOXICAM 7.5 MG PO TABS: 7.5000 mg | ORAL_TABLET | Freq: Every day | ORAL | 0 refills | Status: DC

## 2016-01-12 MED ORDER — DIAZEPAM 5 MG PO TABS
5.0000 mg | ORAL_TABLET | Freq: Once | ORAL | Status: AC
  Administered 2016-01-12: 5 mg via ORAL
  Filled 2016-01-12: qty 1

## 2016-01-12 MED ORDER — KETOROLAC TROMETHAMINE 60 MG/2ML IM SOLN
30.0000 mg | Freq: Once | INTRAMUSCULAR | Status: AC
  Administered 2016-01-12: 30 mg via INTRAMUSCULAR
  Filled 2016-01-12: qty 2

## 2016-01-12 NOTE — ED Triage Notes (Signed)
Complains of lower back pain/bilateral hip pain. Denies injury.

## 2016-01-12 NOTE — ED Provider Notes (Signed)
[facility]-EMERGENCY DEPT Provider Note   CSN: [id number] Arrival date & time: 01/12/16  1338     History   Chief Complaint Chief Complaint  Patient presents with  . Back Pain    HPI Dakota Karma Greaser Sieh is a 25 y.o. female presenting with mid lower back pain with radiation into her bilateral pelvis and upper thighs. She reports a history of scoliosis in her lower spine and has chronic back pain problems.  Two days ago she was standing up from a seated position when she had sudden onset of worsened pain.  She also endorses a fall she took in the bathtub yesterday secondary to pain and weakness in her legs, and hit her right hip on the edge of the tub.  She states she had to lie there for an hour before she was able to get herself up.  She has applied ice to her back and has taken ibuprofen without relief of pain.  She reports a numb sensation in her right leg. She denies urinary or fecal incontinence or retention and denies IVDU and CA. She has had no fevers or chills.   HPI  Past Medical History:  Diagnosis Date  . Abdominal cramps 01/25/2015  . Asthma   . Back pain   . Chlamydia   . Depression   . Dysmenorrhea 06/04/2013  . Herpes   . IBS (irritable bowel syndrome)   . Lymph nodes enlarged 06/04/2013   Bilateral in groin L>R +trich  . Migraine   . Missed periods 01/25/2015  . Trichomonal vaginitis 06/04/2013    Patient Active Problem List   Diagnosis Date Noted  . Herpes 01/25/2015  . Missed periods 01/25/2015  . Abdominal cramps 01/25/2015  . 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 Chlamydia infection 06/05/2013  . Vaginal discharge 06/04/2013  . Trichomonal vaginitis 06/04/2013  . Lymph nodes enlarged 06/04/2013  . Dysmenorrhea 06/04/2013    Past Surgical History:  Procedure Laterality Date  . TONSILLECTOMY      OB History    Gravida Para Term Preterm AB Living   3       3     SAB TAB Ectopic Multiple Live Births   3               Home Medications      Prior to Admission medications   Medication Sig Start Date End Date Taking? Authorizing Provider  albuterol (PROVENTIL HFA;VENTOLIN HFA) 108 (90 BASE) MCG/ACT inhaler Inhale 2 puffs into the lungs every 4 (four) hours as needed for wheezing or shortness of breath. Patient not taking: Reported on 01/25/2015 07/12/13   Gilda Creasehristopher J Pollina, MD  dicyclomine (BENTYL) 20 MG tablet Take 1 tablet (20 mg total) by mouth 4 (four) times daily as needed (abdominal cramping or vomiting). 06/12/15   Devoria AlbeIva Knapp, MD  medroxyPROGESTERone (PROVERA) 10 MG tablet Take 1 tablet (10 mg total) by mouth daily. 01/25/15   Adline PotterJennifer A Griffin, NP  methocarbamol (ROBAXIN) 500 MG tablet Take 1 or 2 po Q 6hrs for body aches or muscle spasms pain 06/12/15   Devoria AlbeIva Knapp, MD  naproxen sodium (ANAPROX) 550 MG tablet Take 1 tablet (550 mg total) by mouth 2 (two) times daily with a meal. 01/25/15   Adline PotterJennifer A Griffin, NP  promethazine (PHENERGAN) 12.5 MG tablet Take 1 tablet (12.5 mg total) by mouth every 6 (six) hours as needed for nausea or vomiting. 09/29/14   Hope Orlene OchM Neese, NP  valACYclovir (VALTREX) 1000 MG tablet Take  1 daily bid for 5 days then 1 daily 01/25/15   Adline PotterJennifer A Griffin, NP    Family History Family History  Problem Relation Age of Onset  . Depression Mother   . Anxiety disorder Mother   . Depression Father   . Alcohol abuse Father   . Other Sister     was in MVA  . Anxiety disorder Sister   . Other Brother     heart condition  . Depression Brother   . Anxiety disorder Brother   . Depression Sister   . Anxiety disorder Sister   . Depression Sister   . Anxiety disorder Sister   . Depression Brother   . Anxiety disorder Brother   . Obesity Brother   . Depression Brother   . Anxiety disorder Brother   . Depression Brother   . Anxiety disorder Brother   . Depression Brother   . Anxiety disorder Brother   . Depression Brother   . Anxiety disorder Brother   . Depression Brother   . Anxiety disorder  Brother   . Depression Brother   . Anxiety disorder Brother   . Other Paternal Grandmother     heart stopped  . Dementia Maternal Grandmother   . Heart disease Maternal Grandmother   . Hypertension Maternal Grandmother   . Other Brother     was in MVA    Social History Social History  Substance Use Topics  . Smoking status: Current Every Day Smoker    Packs/day: 1.00    Years: 7.00    Types: Cigarettes  . Smokeless tobacco: Never Used  . Alcohol use No     Allergies   Hydrocodone; Raspberry; Tramadol; and Other   Review of Systems Review of Systems  Constitutional: Negative for fever.  Respiratory: Negative for shortness of breath.   Cardiovascular: Negative for chest pain and leg swelling.  Gastrointestinal: Negative for abdominal distention, abdominal pain and constipation.  Genitourinary: Negative for difficulty urinating, dysuria, flank pain, frequency and urgency.  Musculoskeletal: Positive for back pain. Negative for gait problem and joint swelling.  Skin: Negative for rash.  Neurological: Negative for weakness and numbness.     Physical Exam Updated Vital Signs BP 121/69 (BP Location: Left Arm)   Pulse 105 Comment: pt crying at this time   Temp 98.2 F (36.8 C) (Oral)   Resp 25   Ht 5' (1.524 m)   Wt 45.4 kg   SpO2 100%   BMI 19.53 kg/m   Physical Exam  Constitutional: She appears well-developed and well-nourished.  Tearful, sobbing.  HENT:  Head: Normocephalic.  Eyes: Conjunctivae are normal.  Neck: Normal range of motion. Neck supple.  Cardiovascular: Normal rate and intact distal pulses.   Pedal pulses normal.  Pulmonary/Chest: Effort normal.  Abdominal: Soft. Bowel sounds are normal. She exhibits no distension and no mass.  Musculoskeletal: Normal range of motion. She exhibits no edema.       Lumbar back: She exhibits tenderness. She exhibits no swelling, no edema and no spasm.  Neurological: She is alert. She has normal strength. She  displays no atrophy and no tremor. No sensory deficit. Gait normal.  Reflex Scores:      Patellar reflexes are 2+ on the right side and 2+ on the left side.      Achilles reflexes are 2+ on the right side and 2+ on the left side. No strength deficit noted in hip and knee flexor and extensor muscle groups.  Ankle flexion and extension  intact.  Skin: Skin is warm and dry.  Psychiatric: Her mood appears anxious.  Nursing note and vitals reviewed.    ED Treatments / Results  Labs (all labs ordered are listed, but only abnormal results are displayed) Labs Reviewed  POC URINE PREG, ED    EKG  EKG Interpretation None       Radiology Dg Lumbar Spine Complete  Result Date: 01/12/2016 CLINICAL DATA:  Initial valuation for acute trauma, fall. Acute back pain. For EXAM: LUMBAR SPINE - COMPLETE 4+ VIEW COMPARISON:  None. FINDINGS: Five non rib-bearing lumbar type vertebral bodies present. Vertebral bodies normally aligned with preservation of the normal lumbar lordosis. No fracture or malalignment. Visualized sacrum intact. No significant degenerative spondylolysis seen within the lumbar spine. No acute soft tissue abnormality. Scattered stool within the visualized colon with scattered enteric contrast material. IMPRESSION: No acute abnormality identified within the lumbar spine. Electronically Signed   By: Rise Mu M.D.   On: 01/12/2016 17:39   Dg Hips Bilat W Or Wo Pelvis 3-4 Views  Result Date: 01/12/2016 CLINICAL DATA:  Initial evaluation for acute trauma, fall. EXAM: DG HIP (WITH OR WITHOUT PELVIS) 3-4V BILAT COMPARISON:  None. FINDINGS: No acute fracture or dislocation. Visualized bony pelvis intact. SI joints approximated. Femoral heads in normal line with the acetabula. Femoral head heights well preserved. Osseous excrescence at the lateral aspect of the subtrochanteric proximal right femur most compatible with an osteochondroma. No other focal osseous lesions. Visualized soft  tissues demonstrate no acute abnormality. IMPRESSION: 1. No acute osseous abnormality about the hips bilaterally. 2. Proximal right femoral osteochondroma. Electronically Signed   By: Rise Mu M.D.   On: 01/12/2016 17:43    Procedures Procedures (including critical care time)  Medications Ordered in ED Medications  diazepam (VALIUM) tablet 5 mg (not administered)  ketorolac (TORADOL) injection 30 mg (not administered)  ondansetron (ZOFRAN-ODT) disintegrating tablet 8 mg (8 mg Oral Given 01/12/16 1559)  HYDROmorphone (DILAUDID) injection 1 mg (1 mg Intramuscular Given 01/12/16 1559)     Initial Impression / Assessment and Plan / ED Course  I have reviewed the triage vital signs and the nursing notes.  Pertinent labs & imaging results that were available during my care of the patient were reviewed by me and considered in my medical decision making (see chart for details).  Clinical Course    Pt was very tearful, sobbing almost uncontrollably during initial interview.  Pt was given IM dilaudid while awaiting imaging studies.  She had increasing pain, sobbing uncontrollably still which seems to worsen when caregivers or new family member visitors  presented.  Suspect anxiety as component to pt presentation. She was unable to sit on the edge of the bed or ambulate for complete exam secondary to pain.  She was given an IM injection of toradol and PO valium after which she was able to sit and stand, complete full neuro exam.  She was prescribed mobic and robaxin. Advised ice tx to areas of pain for the next 2 days, adding heat on day 3. Prn f/u anticipated.  Final Clinical Impressions(s) / ED Diagnoses   Final diagnoses:  None    New Prescriptions Discharge Medication List as of 01/12/2016  6:08 PM    START taking these medications   Details  meloxicam (MOBIC) 7.5 MG tablet Take 1-2 tablets (7.5-15 mg total) by mouth daily., Starting Thu 01/12/2016, Print         Burgess Amor,  PA-C 01/13/16 [phone]    Barbara Cower  Mesner, MD 01/15/16 1200

## 2016-01-12 NOTE — Discharge Instructions (Signed)
Do not drive within 4 hours of taking robaxin as this may make you drowsy.  Avoid lifting,  Bending,  Twisting or any other activity that worsens your pain over the next week.  Apply an  icepack  to your lower back for 10-15 minutes every 2 hours for the next 2 days.  You may also try a heating pad for 20 minutes several times daily as discussed. You should get rechecked if your symptoms are not better over the next 5 days,  Or you develop increased pain,  Weakness in your leg(s) or loss of bladder or bowel function - these may be symptoms of a worsening condition.

## 2016-02-11 ENCOUNTER — Encounter (HOSPITAL_COMMUNITY): Payer: Self-pay | Admitting: Emergency Medicine

## 2016-02-11 ENCOUNTER — Emergency Department (HOSPITAL_COMMUNITY)
Admission: EM | Admit: 2016-02-11 | Discharge: 2016-02-11 | Disposition: A | Discharge status: Home / Self Care | Payer: Medicaid Other | Attending: Emergency Medicine | Admitting: Emergency Medicine

## 2016-02-11 DIAGNOSIS — J45909 Unspecified asthma, uncomplicated: Secondary | ICD-10-CM | POA: Insufficient documentation

## 2016-02-11 DIAGNOSIS — Z79899 Other long term (current) drug therapy: Secondary | ICD-10-CM | POA: Insufficient documentation

## 2016-02-11 DIAGNOSIS — L659 Nonscarring hair loss, unspecified: Secondary | ICD-10-CM | POA: Insufficient documentation

## 2016-02-11 DIAGNOSIS — F17210 Nicotine dependence, cigarettes, uncomplicated: Secondary | ICD-10-CM | POA: Insufficient documentation

## 2016-02-11 NOTE — ED Provider Notes (Signed)
[facility] DEPT Provider Note   CSN: [id number] Arrival date & time: 02/11/16  1621     History   Chief Complaint Chief Complaint  Patient presents with  . Panic Attack    HPI Mary [name] is a 25 y.o. female.  The patient is a 25 year old female, she has a known history of IV drug use, she states that she injects Opana as well as Dilaudid, she states that over the last 3 weeks she has just been injecting water as a means of trying to get off of the IV drugs. She recently had a roommate die after using IV drugs, according to the patient before she started to have trouble she started to lose her hair. The patient noted today that when somebody looked at her hair she had a large patch approximately 4 inches in diameter where there was no hair. The patient began to have an anxiety attack, crying hysterically stating that she was concerned that she was going to die as well. She denies systemic symptoms.      Past Medical History:  Diagnosis Date  . Abdominal cramps 01/25/2015  . Asthma   . Back pain   . Chlamydia   . Depression   . Dysmenorrhea 06/04/2013  . Herpes   . IBS (irritable bowel syndrome)   . Lymph nodes enlarged 06/04/2013   Bilateral in groin L>R +trich  . Migraine   . Missed periods 01/25/2015  . Trichomonal vaginitis 06/04/2013    Patient Active Problem List   Diagnosis Date Noted  . Herpes 01/25/2015  . Missed periods 01/25/2015  . Abdominal cramps 01/25/2015  . 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 Chlamydia infection 06/05/2013  . Vaginal discharge 06/04/2013  . Trichomonal vaginitis 06/04/2013  . Lymph nodes enlarged 06/04/2013  . Dysmenorrhea 06/04/2013    Past Surgical History:  Procedure Laterality Date  . TONSILLECTOMY      OB History    Gravida Para Term Preterm AB Living   3       3     SAB TAB Ectopic Multiple Live Births   3               Home Medications    Prior to Admission medications   Medication Sig  Start Date End Date Taking? Authorizing Provider  albuterol (PROVENTIL HFA;VENTOLIN HFA) 108 (90 BASE) MCG/ACT inhaler Inhale 2 puffs into the lungs every 4 (four) hours as needed for wheezing or shortness of breath. Patient not taking: Reported on 01/25/2015 07/12/13   Gilda Crease, MD  dicyclomine (BENTYL) 20 MG tablet Take 1 tablet (20 mg total) by mouth 4 (four) times daily as needed (abdominal cramping or vomiting). 06/12/15   Devoria Albe, MD  medroxyPROGESTERone (PROVERA) 10 MG tablet Take 1 tablet (10 mg total) by mouth daily. 01/25/15   [name], NP  meloxicam (MOBIC) 7.5 MG tablet Take 1-2 tablets (7.5-15 mg total) by mouth daily. 01/12/16   Burgess Amor, PA-C  naproxen sodium (ANAPROX) 550 MG tablet Take 1 tablet (550 mg total) by mouth 2 (two) times daily with a meal. 01/25/15   [name], NP  promethazine (PHENERGAN) 12.5 MG tablet Take 1 tablet (12.5 mg total) by mouth every 6 (six) hours as needed for nausea or vomiting. 09/29/14   Hope Orlene Och, NP  valACYclovir (VALTREX) 1000 MG tablet Take 1 daily bid for 5 days then 1 daily 01/25/15   [name], NP    Family History  Family History  Problem Relation Age of Onset  . Depression Mother   . Anxiety disorder Mother   . Depression Father   . Alcohol abuse Father   . Other Sister     was in MVA  . Anxiety disorder Sister   . Other Brother     heart condition  . Depression Brother   . Anxiety disorder Brother   . Depression Sister   . Anxiety disorder Sister   . Depression Sister   . Anxiety disorder Sister   . Depression Brother   . Anxiety disorder Brother   . Obesity Brother   . Depression Brother   . Anxiety disorder Brother   . Depression Brother   . Anxiety disorder Brother   . Depression Brother   . Anxiety disorder Brother   . Depression Brother   . Anxiety disorder Brother   . Depression Brother   . Anxiety disorder Brother   . Depression Brother   . Anxiety disorder Brother   .  Other Paternal Grandmother     heart stopped  . Dementia Maternal Grandmother   . Heart disease Maternal Grandmother   . Hypertension Maternal Grandmother   . Other Brother     was in MVA    Social History Social History  Substance Use Topics  . Smoking status: Current Every Day Smoker    Packs/day: 1.00    Years: 7.00    Types: Cigarettes  . Smokeless tobacco: Never Used  . Alcohol use No     Allergies   Hydrocodone; Raspberry; Tramadol; and Other   Review of Systems Review of Systems  All other systems reviewed and are negative.    Physical Exam Updated Vital Signs BP 123/85 (BP Location: Left Arm)   Pulse (!) 125   Temp 98.7 F (37.1 C) (Oral)   Resp 22   Ht 5' (1.524 m)   Wt 100 lb (45.4 kg)   LMP 12/11/2015 Comment: irregular  SpO2 100%   BMI 19.53 kg/m   Physical Exam  Constitutional: She appears well-developed and well-nourished. No distress.  HENT:  Head: Normocephalic and atraumatic.  Mouth/Throat: Oropharynx is clear and moist. No oropharyngeal exudate.  4 inch area of hair loss to the left parieto-occipital area, no broken hair shafts  Eyes: Conjunctivae and EOM are normal. Pupils are equal, round, and reactive to light. Right eye exhibits no discharge. Left eye exhibits no discharge. No scleral icterus.  Neck: Normal range of motion. Neck supple. No JVD present. No thyromegaly present.  Cardiovascular: Regular rhythm, normal heart sounds and intact distal pulses.  Exam reveals no gallop and no friction rub.   No murmur heard. Mild tachycardia  Pulmonary/Chest: Effort normal and breath sounds normal. No respiratory distress. She has no wheezes. She has no rales.  Abdominal: Soft. Bowel sounds are normal. She exhibits no distension and no mass. There is no tenderness.  Musculoskeletal: Normal range of motion. She exhibits no edema or tenderness.  Lymphadenopathy:    She has no cervical adenopathy.  Neurological: She is alert. Coordination normal.   Moves all 4 extremities, follows commands, normal speech, normal memory  Skin: Skin is warm and dry. No rash noted. No erythema.  There are track marks along the left antecubital fossa as well as the left wrist on the radial surface, no surrounding erythema induration or fluctuance  Psychiatric:  Crying hysterically  Nursing note and vitals reviewed.    ED Treatments / Results  Labs (all labs ordered are  listed, but only abnormal results are displayed) Labs Reviewed  COMPREHENSIVE METABOLIC PANEL  CBC WITH DIFFERENTIAL/PLATELET     Radiology No results found.  Procedures Procedures (including critical care time)  Medications Ordered in ED Medications - No data to display   Initial Impression / Assessment and Plan / ED Course  I have reviewed the triage vital signs and the nursing notes.  Pertinent labs & imaging results that were available during my care of the patient were reviewed by me and considered in my medical decision making (see chart for details).  Clinical Course   Pt requests d/c - she is not suicidal - she refuses blood work that she initially requested - doubt pathological source to her hair loss.  Final Clinical Impressions(s) / ED Diagnoses   Final diagnoses:  Hair loss    New Prescriptions New Prescriptions   No medications on file     Eber Hong, MD 02/11/16 1819

## 2016-02-11 NOTE — ED Triage Notes (Signed)
PT states she has been crying and shaking for 2.5 hours today after finding a section of hair loss to left side of scalp today. PT states she used to be on depression/anxiety medication a few years ago but no longer takes them. PT denies any SI/HI.

## 2016-09-27 ENCOUNTER — Emergency Department (HOSPITAL_COMMUNITY)
Admission: EM | Admit: 2016-09-27 | Discharge: 2016-09-27 | Disposition: A | Discharge status: Home / Self Care | Payer: Medicaid Other | Attending: Dermatology | Admitting: Dermatology

## 2016-09-27 ENCOUNTER — Encounter (HOSPITAL_COMMUNITY): Payer: Self-pay | Admitting: Emergency Medicine

## 2016-09-27 DIAGNOSIS — F17210 Nicotine dependence, cigarettes, uncomplicated: Secondary | ICD-10-CM | POA: Insufficient documentation

## 2016-09-27 DIAGNOSIS — J45909 Unspecified asthma, uncomplicated: Secondary | ICD-10-CM | POA: Insufficient documentation

## 2016-09-27 DIAGNOSIS — R109 Unspecified abdominal pain: Secondary | ICD-10-CM | POA: Insufficient documentation

## 2016-09-27 DIAGNOSIS — Z5321 Procedure and treatment not carried out due to patient leaving prior to being seen by health care provider: Secondary | ICD-10-CM | POA: Insufficient documentation

## 2016-09-27 LAB — COMPREHENSIVE METABOLIC PANEL
ALBUMIN: 4.5 g/dL (ref 3.5–5.0)
ALK PHOS: 114 U/L (ref 38–126)
ALT: 29 U/L (ref 14–54)
AST: 28 U/L (ref 15–41)
Anion gap: 13 (ref 5–15)
BILIRUBIN TOTAL: 1 mg/dL (ref 0.3–1.2)
BUN: 14 mg/dL (ref 6–20)
CALCIUM: 10 mg/dL (ref 8.9–10.3)
CO2: 25 mmol/L (ref 22–32)
Chloride: 102 mmol/L (ref 101–111)
Creatinine, Ser: 1.19 mg/dL — ABNORMAL HIGH (ref 0.44–1.00)
GFR calc Af Amer: >60 mL/min (ref >60)
GFR calc non Af Amer: >60 mL/min (ref >60)
GLUCOSE: 100 mg/dL — AB (ref 65–99)
POTASSIUM: 3.1 mmol/L — AB (ref 3.5–5.1)
SODIUM: 140 mmol/L (ref 135–145)
Total Protein: 8.6 g/dL — ABNORMAL HIGH (ref 6.5–8.1)

## 2016-09-27 LAB — CBC WITH DIFFERENTIAL/PLATELET
BASOS PCT: 0 %
Basophils Absolute: 0 10*3/uL (ref 0.0–0.1)
EOS ABS: 0 10*3/uL (ref 0.0–0.7)
EOS PCT: 0 %
HCT: 38.5 % (ref 36.0–46.0)
Hemoglobin: 13.1 g/dL (ref 12.0–15.0)
LYMPHS ABS: 3.5 10*3/uL (ref 0.7–4.0)
Lymphocytes Relative: 22 %
MCH: 29.6 pg (ref 26.0–34.0)
MCHC: 34 g/dL (ref 30.0–36.0)
MCV: 86.9 fL (ref 78.0–100.0)
MONO ABS: 1.1 10*3/uL — AB (ref 0.1–1.0)
MONOS PCT: 7 %
Neutro Abs: 11.7 10*3/uL — ABNORMAL HIGH (ref 1.7–7.7)
Neutrophils Relative %: 71 %
PLATELETS: 398 10*3/uL (ref 150–400)
RBC: 4.43 MIL/uL (ref 3.87–5.11)
RDW: 12.5 % (ref 11.5–15.5)
WBC: 16.5 10*3/uL — ABNORMAL HIGH (ref 4.0–10.5)

## 2016-09-27 NOTE — ED Notes (Signed)
Pt states she does not want to be seen and will return to her mother's house.  Pt signed ama

## 2016-09-27 NOTE — ED Notes (Signed)
Pt is ambulatory without diff, states her mother is in the waiting room to get her back home.

## 2016-09-27 NOTE — ED Triage Notes (Signed)
Pt states that she normally takes pills and marijuana daily.  Pt states that her stomach is hurting and that her IBS is worse when she is having withdrawals.  Pt unable to sit still in triage and unable to maintain a fluid stream of conversation.

## 2016-09-28 ENCOUNTER — Encounter (HOSPITAL_COMMUNITY): Payer: Self-pay

## 2016-09-28 ENCOUNTER — Other Ambulatory Visit: Payer: Self-pay

## 2016-09-28 ENCOUNTER — Emergency Department (HOSPITAL_COMMUNITY)
Admission: EM | Admit: 2016-09-28 | Discharge: 2016-09-28 | Disposition: A | Discharge status: Home / Self Care | Payer: Medicaid Other | Attending: Emergency Medicine | Admitting: Emergency Medicine

## 2016-09-28 DIAGNOSIS — Z79899 Other long term (current) drug therapy: Secondary | ICD-10-CM | POA: Insufficient documentation

## 2016-09-28 DIAGNOSIS — R569 Unspecified convulsions: Secondary | ICD-10-CM

## 2016-09-28 DIAGNOSIS — F17210 Nicotine dependence, cigarettes, uncomplicated: Secondary | ICD-10-CM | POA: Insufficient documentation

## 2016-09-28 DIAGNOSIS — R55 Syncope and collapse: Secondary | ICD-10-CM

## 2016-09-28 DIAGNOSIS — F1910 Other psychoactive substance abuse, uncomplicated: Secondary | ICD-10-CM | POA: Insufficient documentation

## 2016-09-28 DIAGNOSIS — J45909 Unspecified asthma, uncomplicated: Secondary | ICD-10-CM | POA: Insufficient documentation

## 2016-09-28 LAB — COMPREHENSIVE METABOLIC PANEL
ALT: 26 U/L (ref 14–54)
AST: 22 U/L (ref 15–41)
Albumin: 4.4 g/dL (ref 3.5–5.0)
Alkaline Phosphatase: 110 U/L (ref 38–126)
Anion gap: 13 (ref 5–15)
BILIRUBIN TOTAL: 0.8 mg/dL (ref 0.3–1.2)
BUN: 16 mg/dL (ref 6–20)
CHLORIDE: 104 mmol/L (ref 101–111)
CO2: 23 mmol/L (ref 22–32)
CREATININE: 1.04 mg/dL — AB (ref 0.44–1.00)
Calcium: 9.8 mg/dL (ref 8.9–10.3)
GFR calc Af Amer: >60 mL/min (ref >60)
Glucose, Bld: 90 mg/dL (ref 65–99)
Potassium: 2.8 mmol/L — ABNORMAL LOW (ref 3.5–5.1)
Sodium: 140 mmol/L (ref 135–145)
TOTAL PROTEIN: 8.6 g/dL — AB (ref 6.5–8.1)

## 2016-09-28 LAB — CBC WITH DIFFERENTIAL/PLATELET
BASOS ABS: 0 10*3/uL (ref 0.0–0.1)
Basophils Relative: 0 %
Eosinophils Absolute: 0 10*3/uL (ref 0.0–0.7)
Eosinophils Relative: 0 %
HEMATOCRIT: 38.2 % (ref 36.0–46.0)
Hemoglobin: 13 g/dL (ref 12.0–15.0)
LYMPHS PCT: 26 %
Lymphs Abs: 3.6 10*3/uL (ref 0.7–4.0)
MCH: 29.5 pg (ref 26.0–34.0)
MCHC: 34 g/dL (ref 30.0–36.0)
MCV: 86.8 fL (ref 78.0–100.0)
Monocytes Absolute: 0.9 10*3/uL (ref 0.1–1.0)
Monocytes Relative: 6 %
NEUTROS ABS: 9.5 10*3/uL — AB (ref 1.7–7.7)
Neutrophils Relative %: 68 %
Platelets: 386 10*3/uL (ref 150–400)
RBC: 4.4 MIL/uL (ref 3.87–5.11)
RDW: 12.4 % (ref 11.5–15.5)
WBC: 14.1 10*3/uL — AB (ref 4.0–10.5)

## 2016-09-28 LAB — SALICYLATE LEVEL

## 2016-09-28 LAB — POC URINE PREG, ED: PREG TEST UR: NEGATIVE

## 2016-09-28 LAB — ACETAMINOPHEN LEVEL: Acetaminophen (Tylenol), Serum: <10 ug/mL — ABNORMAL LOW (ref 10–30)

## 2016-09-28 LAB — RAPID URINE DRUG SCREEN, HOSP PERFORMED
Amphetamines: POSITIVE — AB
Barbiturates: NOT DETECTED
Benzodiazepines: POSITIVE — AB
Cocaine: POSITIVE — AB
OPIATES: POSITIVE — AB
TETRAHYDROCANNABINOL: POSITIVE — AB

## 2016-09-28 MED ORDER — NALOXONE HCL 0.4 MG/ML IJ SOLN
0.4000 mg | Freq: Once | INTRAMUSCULAR | Status: AC
  Administered 2016-09-28: 0.4 mg via INTRAVENOUS
  Filled 2016-09-28: qty 1

## 2016-09-28 MED ORDER — NALOXONE HCL 4 MG/0.1ML NA LIQD
1.0000 | Freq: Once | NASAL | Status: AC
  Administered 2016-09-28: 1 via NASAL
  Filled 2016-09-28: qty 4

## 2016-09-28 MED ORDER — SODIUM CHLORIDE 0.9 % IV BOLUS (SEPSIS)
1000.0000 mL | Freq: Once | INTRAVENOUS | Status: AC
  Administered 2016-09-28: 1000 mL via INTRAVENOUS

## 2016-09-28 NOTE — ED Triage Notes (Signed)
Pt was here earlier tonight for withdrawal symptoms and signed herself out without being seen.  Pt went home and reportedly had a seizure witnessed by pt's mother.

## 2016-09-28 NOTE — ED Notes (Signed)
Explained how to use narcan kit, insisted that 911 be called in the event the kit had to be used.  Mother and patient states understanding  Pt states understanding of care given and follow up instructions.  Pt a/o and able to carry on a conversation.  Pt ambulated from ED with steady gait.

## 2016-09-28 NOTE — ED Notes (Signed)
When pt left on previous visit, she was a/o and ambulated from ED with steady gait with a female companion.  She easily answered questions appropriately.  Upon arrival with EMS pt is difficult to arouse, thinks it is March, unable to maintain eye contact, unable to follow conversation

## 2016-09-28 NOTE — ED Provider Notes (Signed)
[facility] Provider Note   CSN: [id number] Arrival date & time: 09/28/16  0111     History   Chief Complaint Chief Complaint  Patient presents with  . Seizures    HPI Mary [name] is a 26 y.o. female.  Patient brought to the emergency department for evaluation after syncopal episode. Patient had reportedly been here in the ER earlier tonight, but left before being seen. Mother reports that she had been missing for the last 24 hours, however friends. Mother reports that she seemed "high" when she got home tonight. Patient has opioid addiction, injects crushed up pain tablets. Mother reports that she seemed "manic" when she got home from the ER. Mother then, however, found her on the floor in the bathroom. She reports that her eyes rolled up and she was shaking, like she was having a seizure.  At arrival, patient is somnolent. She has difficulty staying awake to answer questions. Level V Caveat due to decreased LOC.      Past Medical History:  Diagnosis Date  . Abdominal cramps 01/25/2015  . Asthma   . Back pain   . Chlamydia   . Depression   . Dysmenorrhea 06/04/2013  . Herpes   . IBS (irritable bowel syndrome)   . Lymph nodes enlarged 06/04/2013   Bilateral in groin L>R +trich  . Migraine   . Missed periods 01/25/2015  . Trichomonal vaginitis 06/04/2013    Patient Active Problem List   Diagnosis Date Noted  . Herpes 01/25/2015  . Missed periods 01/25/2015  . Abdominal cramps 01/25/2015  . 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 Chlamydia infection 06/05/2013  . Vaginal discharge 06/04/2013  . Trichomonal vaginitis 06/04/2013  . Lymph nodes enlarged 06/04/2013  . Dysmenorrhea 06/04/2013    Past Surgical History:  Procedure Laterality Date  . TONSILLECTOMY      OB History    Gravida Para Term Preterm AB Living   3       3     SAB TAB Ectopic Multiple Live Births   3               Home Medications    Prior to Admission  medications   Medication Sig Start Date End Date Taking? Authorizing Provider  albuterol (PROVENTIL HFA;VENTOLIN HFA) 108 (90 BASE) MCG/ACT inhaler Inhale 2 puffs into the lungs every 4 (four) hours as needed for wheezing or shortness of breath. Patient not taking: Reported on 01/25/2015 07/12/13   Gilda Creasehristopher J Pollina, MD  dicyclomine (BENTYL) 20 MG tablet Take 1 tablet (20 mg total) by mouth 4 (four) times daily as needed (abdominal cramping or vomiting). 06/12/15   Devoria AlbeIva Knapp, MD  medroxyPROGESTERone (PROVERA) 10 MG tablet Take 1 tablet (10 mg total) by mouth daily. 01/25/15   Adline PotterJennifer A Griffin, NP  meloxicam (MOBIC) 7.5 MG tablet Take 1-2 tablets (7.5-15 mg total) by mouth daily. 01/12/16   Burgess AmorJulie Idol, PA-C  naproxen sodium (ANAPROX) 550 MG tablet Take 1 tablet (550 mg total) by mouth 2 (two) times daily with a meal. 01/25/15   Adline PotterJennifer A Griffin, NP  promethazine (PHENERGAN) 12.5 MG tablet Take 1 tablet (12.5 mg total) by mouth every 6 (six) hours as needed for nausea or vomiting. 09/29/14   Hope Orlene OchM Neese, NP  valACYclovir (VALTREX) 1000 MG tablet Take 1 daily bid for 5 days then 1 daily 01/25/15   Adline PotterJennifer A Griffin, NP    Family History Family History  Problem Relation Age of Onset  .  Depression Mother   . Anxiety disorder Mother   . Depression Father   . Alcohol abuse Father   . Other Sister     was in MVA  . Anxiety disorder Sister   . Other Brother     heart condition  . Depression Brother   . Anxiety disorder Brother   . Depression Sister   . Anxiety disorder Sister   . Depression Sister   . Anxiety disorder Sister   . Depression Brother   . Anxiety disorder Brother   . Obesity Brother   . Depression Brother   . Anxiety disorder Brother   . Depression Brother   . Anxiety disorder Brother   . Depression Brother   . Anxiety disorder Brother   . Depression Brother   . Anxiety disorder Brother   . Depression Brother   . Anxiety disorder Brother   . Depression Brother   .  Anxiety disorder Brother   . Other Paternal Grandmother     heart stopped  . Dementia Maternal Grandmother   . Heart disease Maternal Grandmother   . Hypertension Maternal Grandmother   . Other Brother     was in MVA    Social History Social History  Substance Use Topics  . Smoking status: Current Every Day Smoker    Packs/day: 1.00    Years: 7.00    Types: Cigarettes  . Smokeless tobacco: Never Used  . Alcohol use Yes     Allergies   Hydrocodone; Raspberry; Tramadol; and Other   Review of Systems Review of Systems  Unable to perform ROS: Patient nonverbal     Physical Exam Updated Vital Signs BP 116/83   Pulse 95   Temp 98.6 F (37 C) (Oral)   Resp (!) 24   LMP  (LMP Unknown)   SpO2 97%   Physical Exam  Constitutional: No distress.  Extremely thin  HENT:  Head: Normocephalic and atraumatic.  Right Ear: Hearing normal.  Left Ear: Hearing normal.  Nose: Nose normal.  Mouth/Throat: Oropharynx is clear and moist and mucous membranes are normal.  Eyes:  Pupils small, sluggish  Neck: Normal range of motion. Neck supple.  Cardiovascular: Regular rhythm, S1 normal and S2 normal.  Exam reveals no gallop and no friction rub.   No murmur heard. Pulmonary/Chest: Effort normal and breath sounds normal. No respiratory distress. She exhibits no tenderness.  Abdominal: Soft. Normal appearance and bowel sounds are normal. There is no hepatosplenomegaly. There is no tenderness. There is no rebound, no guarding, no tenderness at McBurney's point and negative Murphy's sign. No hernia.  Musculoskeletal: Normal range of motion.  Neurological: She has normal strength. No cranial nerve deficit or sensory deficit. Coordination normal. GCS eye subscore is 4. GCS verbal subscore is 4. GCS motor subscore is 6.  Somnolent, speech slurred  Skin: Skin is warm, dry and intact. No rash noted. No cyanosis.  Psychiatric: Her speech is slurred. She is withdrawn.  Nursing note and vitals  reviewed.    ED Treatments / Results  Labs (all labs ordered are listed, but only abnormal results are displayed) Labs Reviewed  CBC WITH DIFFERENTIAL/PLATELET - Abnormal; Notable for the following:       Result Value   WBC 14.1 (*)    Neutro Abs 9.5 (*)    All other components within normal limits  COMPREHENSIVE METABOLIC PANEL - Abnormal; Notable for the following:    Potassium 2.8 (*)    Creatinine, Ser 1.04 (*)  Total Protein 8.6 (*)    All other components within normal limits  RAPID URINE DRUG SCREEN, HOSP PERFORMED - Abnormal; Notable for the following:    Opiates POSITIVE (*)    Cocaine POSITIVE (*)    Benzodiazepines POSITIVE (*)    Amphetamines POSITIVE (*)    Tetrahydrocannabinol POSITIVE (*)    All other components within normal limits  ACETAMINOPHEN LEVEL - Abnormal; Notable for the following:    Acetaminophen (Tylenol), Serum <10 (*)    All other components within normal limits  SALICYLATE LEVEL  POC URINE PREG, ED    EKG  EKG Interpretation None        Radiology No results found.  Procedures Procedures (including critical care time)  Medications Ordered in ED Medications  sodium chloride 0.9 % bolus 1,000 mL (1,000 mLs Intravenous New Bag/Given 09/28/16 0216)  naloxone [facility]) injection 0.4 mg (0.4 mg Intravenous Given 09/28/16 0217)     Initial Impression / Assessment and Plan / ED Course  I have reviewed the triage vital signs and the nursing notes.  Pertinent labs & imaging results that were available during my care of the patient were reviewed by me and considered in my medical decision making (see chart for details).     Patient brought to the emergency department by ambulance from home. Patient had most likely been with friends the last 24 hours engaging in drug abuse. She is not forthcoming about what she had been doing but mother reports that she was missing for that period of time. When she came back she was acting as if she was high.  Mother brought her to the ER earlier but she was belligerent and would not stay to be seen. After getting back home, however, she passed out in the bathroom. Mother describes eyes rolling back and shaking, sounds potentially like a seizure. It is unclear if this was simply syncope. She was extremely somnolent upon arrival which improved with Narcan. She has been monitored for an extended period of time. Drug screen is positive for opiates, cocaine, benzodiazepines, amphetamines, THC. Patient counseled that she needs to seek care for substance abuse or she will likely die. She understands this. She is not homicidal or suicidal at this point, not a candidate for IVC and I cannot place her directly. She was counseled that she will need to make the steps herself. She is going home with her mother tonight.  Final Clinical Impressions(s) / ED Diagnoses   Final diagnoses:  Polysubstance abuse  Syncope, unspecified syncope type  Seizure-like activity [facility])    New Prescriptions New Prescriptions   No medications on file     Gilda Crease, MD 09/28/16 [phone]

## 2016-09-28 NOTE — ED Notes (Signed)
Verbal order per Dr Blinda LeatherwoodPollina to in and out cath

## 2016-10-17 ENCOUNTER — Encounter (HOSPITAL_COMMUNITY): Payer: Self-pay | Admitting: *Deleted

## 2016-10-17 ENCOUNTER — Emergency Department (HOSPITAL_COMMUNITY)
Admission: EM | Admit: 2016-10-17 | Discharge: 2016-10-17 | Disposition: A | Discharge status: Home Health | Payer: Medicaid Other | Attending: Emergency Medicine | Admitting: Emergency Medicine

## 2016-10-17 DIAGNOSIS — F17210 Nicotine dependence, cigarettes, uncomplicated: Secondary | ICD-10-CM | POA: Insufficient documentation

## 2016-10-17 DIAGNOSIS — H18821 Corneal disorder due to contact lens, right eye: Secondary | ICD-10-CM | POA: Insufficient documentation

## 2016-10-17 DIAGNOSIS — Z79899 Other long term (current) drug therapy: Secondary | ICD-10-CM | POA: Insufficient documentation

## 2016-10-17 DIAGNOSIS — S0501XA Injury of conjunctiva and corneal abrasion without foreign body, right eye, initial encounter: Secondary | ICD-10-CM

## 2016-10-17 DIAGNOSIS — S0502XA Injury of conjunctiva and corneal abrasion without foreign body, left eye, initial encounter: Secondary | ICD-10-CM

## 2016-10-17 DIAGNOSIS — H18822 Corneal disorder due to contact lens, left eye: Secondary | ICD-10-CM | POA: Insufficient documentation

## 2016-10-17 DIAGNOSIS — J45909 Unspecified asthma, uncomplicated: Secondary | ICD-10-CM | POA: Insufficient documentation

## 2016-10-17 MED ORDER — KETOROLAC TROMETHAMINE 0.5 % OP SOLN
1.0000 [drp] | Freq: Once | OPHTHALMIC | Status: AC
  Administered 2016-10-17: 1 [drp] via OPHTHALMIC
  Filled 2016-10-17: qty 5

## 2016-10-17 MED ORDER — TOBRAMYCIN 0.3 % OP SOLN
1.0000 [drp] | Freq: Once | OPHTHALMIC | Status: AC
  Administered 2016-10-17: 1 [drp] via OPHTHALMIC
  Filled 2016-10-17: qty 5

## 2016-10-17 MED ORDER — TETRACAINE HCL 0.5 % OP SOLN
2.0000 [drp] | Freq: Once | OPHTHALMIC | Status: DC
  Filled 2016-10-17: qty 4

## 2016-10-17 MED ORDER — FLUORESCEIN SODIUM 0.6 MG OP STRP: 1.0000 | ORAL_STRIP | Freq: Once | OPHTHALMIC | Status: DC

## 2016-10-17 NOTE — ED Notes (Signed)
Pt unable to perform visual acuity due to light sensitivity.

## 2016-10-17 NOTE — Discharge Instructions (Signed)
Apply one drop of the acular to each eye 4 times a day and one drop of the tobramycin to each eye every 4 hrs for 5-7 days.  Call the eye doctor to arrange a follow-up in one week if not improving

## 2016-10-17 NOTE — ED Triage Notes (Signed)
Pt states she fell asleep with her contacts in. When she woke up she had a tear in her left eye contact and she can't find her right eye contact. Pt has pain when blinking or opening eyes. Pt has hx of scratched cornea and states this feels the same.

## 2016-10-19 NOTE — ED Provider Notes (Signed)
[facility]-EMERGENCY DEPT Provider Note   CSN: [id number] Arrival date & time: 10/17/16  1122     History   Chief Complaint Chief Complaint  Patient presents with  . Eye Problem    HPI Mary [name] is a 26 y.o. female.  HPI  Mary [name] is a 26 y.o. female who presents to the Emergency Department complaining of bilateral eye pain and photophobia.  She states that she fell asleep with non-prescription, colored contacts in her eyes.  States that she woke up with pain, excessive tearing and difficulty keeping her eyes open.  Removed the left contact and unsure if the right contact has been removed. She has not applied any drops to her eyes.  She denies headache, dizziness or redness or swelling of the eyelids.    Past Medical History:  Diagnosis Date  . Abdominal cramps 01/25/2015  . Asthma   . Back pain   . Chlamydia   . Depression   . Dysmenorrhea 06/04/2013  . Herpes   . IBS (irritable bowel syndrome)   . Lymph nodes enlarged 06/04/2013   Bilateral in groin L>R +trich  . Migraine   . Missed periods 01/25/2015  . Trichomonal vaginitis 06/04/2013    Patient Active Problem List   Diagnosis Date Noted  . Herpes 01/25/2015  . Missed periods 01/25/2015  . Abdominal cramps 01/25/2015  . 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 Chlamydia infection 06/05/2013  . Vaginal discharge 06/04/2013  . Trichomonal vaginitis 06/04/2013  . Lymph nodes enlarged 06/04/2013  . Dysmenorrhea 06/04/2013    Past Surgical History:  Procedure Laterality Date  . TONSILLECTOMY      OB History    Gravida Para Term Preterm AB Living   3       3     SAB TAB Ectopic Multiple Live Births   3               Home Medications    Prior to Admission medications   Medication Sig Start Date End Date Taking? Authorizing Provider  albuterol (PROVENTIL HFA;VENTOLIN HFA) 108 (90 BASE) MCG/ACT inhaler Inhale 2 puffs into the lungs every 4 (four) hours as needed for wheezing or  shortness of breath. Patient not taking: Reported on 01/25/2015 07/12/13   Gilda CreasePollina, Christopher J, MD  dicyclomine (BENTYL) 20 MG tablet Take 1 tablet (20 mg total) by mouth 4 (four) times daily as needed (abdominal cramping or vomiting). 06/12/15   Devoria AlbeKnapp, Iva, MD  medroxyPROGESTERone (PROVERA) 10 MG tablet Take 1 tablet (10 mg total) by mouth daily. 01/25/15   Adline PotterGriffin, Jennifer A, NP  meloxicam (MOBIC) 7.5 MG tablet Take 1-2 tablets (7.5-15 mg total) by mouth daily. 01/12/16   Burgess AmorIdol, Julie, PA-C  naproxen sodium (ANAPROX) 550 MG tablet Take 1 tablet (550 mg total) by mouth 2 (two) times daily with a meal. 01/25/15   Adline PotterGriffin, Jennifer A, NP  promethazine (PHENERGAN) 12.5 MG tablet Take 1 tablet (12.5 mg total) by mouth every 6 (six) hours as needed for nausea or vomiting. 09/29/14   Janne NapoleonNeese, Hope M, NP  valACYclovir (VALTREX) 1000 MG tablet Take 1 daily bid for 5 days then 1 daily 01/25/15   Adline PotterGriffin, Jennifer A, NP    Family History Family History  Problem Relation Age of Onset  . Depression Mother   . Anxiety disorder Mother   . Depression Father   . Alcohol abuse Father   . Other Sister        was in MVA  .  Anxiety disorder Sister   . Other Brother        heart condition  . Depression Brother   . Anxiety disorder Brother   . Depression Sister   . Anxiety disorder Sister   . Depression Sister   . Anxiety disorder Sister   . Depression Brother   . Anxiety disorder Brother   . Obesity Brother   . Depression Brother   . Anxiety disorder Brother   . Depression Brother   . Anxiety disorder Brother   . Depression Brother   . Anxiety disorder Brother   . Depression Brother   . Anxiety disorder Brother   . Depression Brother   . Anxiety disorder Brother   . Depression Brother   . Anxiety disorder Brother   . Other Paternal Grandmother        heart stopped  . Dementia Maternal Grandmother   . Heart disease Maternal Grandmother   . Hypertension Maternal Grandmother   . Other Brother         was in MVA    Social History Social History  Substance Use Topics  . Smoking status: Current Every Day Smoker    Packs/day: 1.00    Years: 7.00    Types: Cigarettes  . Smokeless tobacco: Never Used  . Alcohol use Yes     Allergies   Hydrocodone; Raspberry; Tramadol; and Other   Review of Systems Review of Systems  Constitutional: Negative for chills and fever.  HENT: Negative for congestion and rhinorrhea.   Eyes: Positive for photophobia and visual disturbance. Negative for pain and redness.  Respiratory: Negative for cough.   Gastrointestinal: Negative for nausea.  Neurological: Negative for dizziness, facial asymmetry, weakness, numbness and headaches.  Hematological: Negative for adenopathy.     Physical Exam Updated Vital Signs BP 122/78 (BP Location: Right Arm)   Pulse 90   Temp 98 F (36.7 C) (Oral)   Ht 5' (1.524 m)   Wt 44 kg (97 lb)   LMP 06/19/2016 Comment: irregular periods  SpO2 100%   BMI 18.94 kg/m   Physical Exam  Constitutional: She is oriented to person, place, and time. She appears well-developed and well-nourished. No distress.  HENT:  Head: Atraumatic.  Mouth/Throat: Oropharynx is clear and moist.  Eyes: EOM and lids are normal. Pupils are equal, round, and reactive to light. Lids are everted and swept, no foreign bodies found. Right eye exhibits no chemosis and no exudate. No foreign body present in the right eye. Left eye exhibits no chemosis, no discharge and no exudate. No foreign body present in the left eye. Right conjunctiva is not injected. Left conjunctiva is not injected.  Fundoscopic exam:      The right eye shows no papilledema.  Slit lamp exam:      The right eye shows corneal abrasion and fluorescein uptake. The right eye shows no corneal ulcer and no anterior chamber bulge.       The left eye shows corneal abrasion and fluorescein uptake. The left eye shows no corneal ulcer and no anterior chamber bulge.    Neck: Normal  range of motion.  Cardiovascular: Normal rate and regular rhythm.   Pulmonary/Chest: Effort normal. No respiratory distress.  Neurological: She is alert and oriented to person, place, and time.  Skin: Skin is warm and dry. No rash noted.  Nursing note and vitals reviewed.    ED Treatments / Results  Labs (all labs ordered are listed, but only abnormal results are displayed) Labs  Reviewed - No data to display  EKG  EKG Interpretation None       Radiology No results found.  Procedures Procedures (including critical care time)  Medications Ordered in ED Medications  tobramycin (TOBREX) 0.3 % ophthalmic solution 1 drop (1 drop Both Eyes Given 10/17/16 1409)  ketorolac (ACULAR) 0.5 % ophthalmic solution 1 drop (1 drop Both Eyes Given 10/17/16 1409)     Initial Impression / Assessment and Plan / ED Course  I have reviewed the triage vital signs and the nursing notes.  Pertinent labs & imaging results that were available during my care of the patient were reviewed by me and considered in my medical decision making (see chart for details).     Bilateral corneal abrasions.  Right > than left.  Dispensed tobramycin and acular.  Referral info for ophtho.  No contacts seen to either eye.    Pt feeling better after exam.  Appears stable for d/c return precautions discussed.   Final Clinical Impressions(s) / ED Diagnoses   Final diagnoses:  Abrasion of right cornea, initial encounter  Abrasion of left cornea, initial encounter    New Prescriptions Discharge Medication List as of 10/17/2016  2:15 PM       Pauline Aus, PA-C 10/19/16 2118    Samuel Jester, DO 10/20/16 1451

## 2017-06-27 ENCOUNTER — Ambulatory Visit: Payer: Self-pay | Admitting: Adult Health

## 2017-06-27 ENCOUNTER — Encounter (INDEPENDENT_AMBULATORY_CARE_PROVIDER_SITE_OTHER): Payer: Self-pay

## 2017-08-24 ENCOUNTER — Other Ambulatory Visit: Payer: Self-pay

## 2017-08-24 ENCOUNTER — Encounter (HOSPITAL_COMMUNITY): Payer: Self-pay | Admitting: Emergency Medicine

## 2017-08-24 ENCOUNTER — Emergency Department (HOSPITAL_COMMUNITY)
Admission: EM | Admit: 2017-08-24 | Discharge: 2017-08-25 | Disposition: A | Discharge status: Home / Self Care | Payer: Self-pay | Attending: Emergency Medicine | Admitting: Emergency Medicine

## 2017-08-24 DIAGNOSIS — F1910 Other psychoactive substance abuse, uncomplicated: Secondary | ICD-10-CM

## 2017-08-24 DIAGNOSIS — L02414 Cutaneous abscess of left upper limb: Secondary | ICD-10-CM | POA: Insufficient documentation

## 2017-08-24 DIAGNOSIS — F17210 Nicotine dependence, cigarettes, uncomplicated: Secondary | ICD-10-CM | POA: Insufficient documentation

## 2017-08-24 DIAGNOSIS — J45909 Unspecified asthma, uncomplicated: Secondary | ICD-10-CM | POA: Insufficient documentation

## 2017-08-24 DIAGNOSIS — Z79899 Other long term (current) drug therapy: Secondary | ICD-10-CM | POA: Insufficient documentation

## 2017-08-24 NOTE — ED Triage Notes (Signed)
Pt states she noticed an abscess on her left wrist 3 days ago. Pt states it has progressively gotten worse since then.

## 2017-08-25 LAB — BASIC METABOLIC PANEL
ANION GAP: 15 (ref 5–15)
BUN: 17 mg/dL (ref 6–20)
CO2: 26 mmol/L (ref 22–32)
Calcium: 10 mg/dL (ref 8.9–10.3)
Chloride: 99 mmol/L — ABNORMAL LOW (ref 101–111)
Creatinine, Ser: 0.91 mg/dL (ref 0.44–1.00)
GFR calc Af Amer: >60 mL/min (ref >60)
Glucose, Bld: 128 mg/dL — ABNORMAL HIGH (ref 65–99)
POTASSIUM: 3.7 mmol/L (ref 3.5–5.1)
SODIUM: 140 mmol/L (ref 135–145)

## 2017-08-25 LAB — CBC WITH DIFFERENTIAL/PLATELET
BASOS ABS: 0 10*3/uL (ref 0.0–0.1)
Basophils Relative: 0 %
EOS PCT: 2 %
Eosinophils Absolute: 0.2 10*3/uL (ref 0.0–0.7)
HCT: 38.2 % (ref 36.0–46.0)
Hemoglobin: 11.9 g/dL — ABNORMAL LOW (ref 12.0–15.0)
LYMPHS PCT: 49 %
Lymphs Abs: 3.7 10*3/uL (ref 0.7–4.0)
MCH: 28.5 pg (ref 26.0–34.0)
MCHC: 31.2 g/dL (ref 30.0–36.0)
MCV: 91.4 fL (ref 78.0–100.0)
Monocytes Absolute: 0.6 10*3/uL (ref 0.1–1.0)
Monocytes Relative: 7 %
NEUTROS ABS: 3.2 10*3/uL (ref 1.7–7.7)
NEUTROS PCT: 42 %
PLATELETS: 417 10*3/uL — AB (ref 150–400)
RBC: 4.18 MIL/uL (ref 3.87–5.11)
RDW: 16.1 % — ABNORMAL HIGH (ref 11.5–15.5)
WBC: 7.7 10*3/uL (ref 4.0–10.5)

## 2017-08-25 LAB — I-STAT BETA HCG BLOOD, ED (MC, WL, AP ONLY): I-stat hCG, quantitative: <5 m[IU]/mL (ref <5)

## 2017-08-25 MED ORDER — CLINDAMYCIN HCL 300 MG PO CAPS: 300.0000 mg | ORAL_CAPSULE | Freq: Four times a day (QID) | ORAL | 0 refills | Status: DC

## 2017-08-25 MED ORDER — CLINDAMYCIN PHOSPHATE 600 MG/50ML IV SOLN
600.0000 mg | Freq: Once | INTRAVENOUS | Status: AC
  Administered 2017-08-25: 600 mg via INTRAVENOUS
  Filled 2017-08-25: qty 50

## 2017-08-25 MED ORDER — POVIDONE-IODINE 10 % EX SOLN
CUTANEOUS | Status: AC
  Administered 2017-08-25: 1
  Filled 2017-08-25: qty 15

## 2017-08-25 MED ORDER — POVIDONE-IODINE 10 % EX OINT
TOPICAL_OINTMENT | Freq: Once | CUTANEOUS | Status: DC
  Filled 2017-08-25: qty 28.35

## 2017-08-25 MED ORDER — LIDOCAINE HCL (PF) 1 % IJ SOLN
INTRAMUSCULAR | Status: AC
  Administered 2017-08-25: 2 mL via INTRADERMAL
  Filled 2017-08-25: qty 2

## 2017-08-25 MED ORDER — LIDOCAINE HCL (PF) 1 % IJ SOLN
2.0000 mL | Freq: Once | INTRAMUSCULAR | Status: AC
  Administered 2017-08-25: 2 mL via INTRADERMAL

## 2017-08-25 NOTE — ED Notes (Signed)
EDP in to lance pt's abscess.

## 2017-08-25 NOTE — ED Provider Notes (Signed)
[facility]PART[facility] Provider Note   CSN: [id number] Arrival date & time: 08/24/17  2344     History   Chief Complaint Chief Complaint  Patient presents with  . Abscess    HPI Mary [name] is a 27 y.o. female.  Patient is a 27 year old female with history of IV drug abuse.  She presents for evaluation of swelling, pain, and redness to the right forearm.  This is been ongoing for the past several days.  She reports fevers at home, but is afebrile here.  The history is provided by the patient.  Abscess  Abscess location: Left forearm. Abscess quality: fluctuance, induration, painful, redness and warmth   Abscess quality: not draining   Duration:  3 days Progression:  Worsening Pain details:    Quality:  Throbbing   Severity:  Severe   Timing:  Constant   Progression:  Worsening Chronicity:  New Context: injected drug use   Relieved by:  Nothing Worsened by:  Draining/squeezing Ineffective treatments:  Draining/squeezing and warm compresses   Past Medical History:  Diagnosis Date  . Abdominal cramps 01/25/2015  . Asthma   . Back pain   . Chlamydia   . Depression   . Dysmenorrhea 06/04/2013  . Herpes   . IBS (irritable bowel syndrome)   . Lymph nodes enlarged 06/04/2013   Bilateral in groin L>R +trich  . Migraine   . Missed periods 01/25/2015  . Trichomonal vaginitis 06/04/2013    Patient Active Problem List   Diagnosis Date Noted  . Herpes 01/25/2015  . Missed periods 01/25/2015  . Abdominal cramps 01/25/2015  . 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 Chlamydia infection 06/05/2013  . Vaginal discharge 06/04/2013  . Trichomonal vaginitis 06/04/2013  . Lymph nodes enlarged 06/04/2013  . Dysmenorrhea 06/04/2013    Past Surgical History:  Procedure Laterality Date  . TONSILLECTOMY       OB History    Gravida  3   Para      Term      Preterm      AB  3   Living        SAB  3   TAB      Ectopic      Multiple      Live Births               Home Medications    Prior to Admission medications   Medication Sig Start Date End Date Taking? Authorizing Provider  albuterol (PROVENTIL HFA;VENTOLIN HFA) 108 (90 BASE) MCG/ACT inhaler Inhale 2 puffs into the lungs every 4 (four) hours as needed for wheezing or shortness of breath. Patient not taking: Reported on 01/25/2015 07/12/13   Gilda CreasePollina, Christopher J, MD  dicyclomine (BENTYL) 20 MG tablet Take 1 tablet (20 mg total) by mouth 4 (four) times daily as needed (abdominal cramping or vomiting). 06/12/15   Devoria AlbeKnapp, Iva, MD  medroxyPROGESTERone (PROVERA) 10 MG tablet Take 1 tablet (10 mg total) by mouth daily. 01/25/15   Adline PotterGriffin, Jennifer A, NP  meloxicam (MOBIC) 7.5 MG tablet Take 1-2 tablets (7.5-15 mg total) by mouth daily. 01/12/16   Burgess AmorIdol, Julie, PA-C  naproxen sodium (ANAPROX) 550 MG tablet Take 1 tablet (550 mg total) by mouth 2 (two) times daily with a meal. 01/25/15   Adline PotterGriffin, Jennifer A, NP  promethazine (PHENERGAN) 12.5 MG tablet Take 1 tablet (12.5 mg total) by mouth every 6 (six) hours as needed for nausea or vomiting. 09/29/14   Kerrie BuffaloNeese, Hope  M, NP  valACYclovir (VALTREX) 1000 MG tablet Take 1 daily bid for 5 days then 1 daily 01/25/15   [name], NP    Family History Family History  Problem Relation Age of Onset  . Depression Mother   . Anxiety disorder Mother   . Depression Father   . Alcohol abuse Father   . Other Sister        was in MVA  . Anxiety disorder Sister   . Other Brother        heart condition  . Depression Brother   . Anxiety disorder Brother   . Depression Sister   . Anxiety disorder Sister   . Depression Sister   . Anxiety disorder Sister   . Depression Brother   . Anxiety disorder Brother   . Obesity Brother   . Depression Brother   . Anxiety disorder Brother   . Depression Brother   . Anxiety disorder Brother   . Depression Brother   . Anxiety disorder Brother   . Depression Brother   . Anxiety  disorder Brother   . Depression Brother   . Anxiety disorder Brother   . Depression Brother   . Anxiety disorder Brother   . Other Paternal Grandmother        heart stopped  . Dementia Maternal Grandmother   . Heart disease Maternal Grandmother   . Hypertension Maternal Grandmother   . Other Brother        was in MVA    Social History Social History   Tobacco Use  . Smoking status: Current Every Day Smoker    Packs/day: 1.00    Years: 7.00    Pack years: 7.00    Types: Cigarettes  . Smokeless tobacco: Never Used  Substance Use Topics  . Alcohol use: Yes  . Drug use: Yes    Types: Marijuana    Comment: two months ago, pills     Allergies   Hydrocodone; Raspberry; Tramadol; and Other   Review of Systems Review of Systems  All other systems reviewed and are negative.    Physical Exam Updated Vital Signs BP (!) 133/94   Pulse 98   Temp 97.6 F (36.4 C) (Oral)   Resp 18   Wt 49.9 kg (110 lb)   LMP 06/27/2017   SpO2 100%   BMI 21.48 kg/m   Physical Exam  Constitutional: She is oriented to person, place, and time. She appears well-developed and well-nourished. No distress.  HENT:  Head: Normocephalic and atraumatic.  Neck: Normal range of motion. Neck supple.  Pulmonary/Chest: Effort normal.  Musculoskeletal: Normal range of motion.  Neurological: She is alert and oriented to person, place, and time.  Skin: Skin is warm and dry. She is not diaphoretic.  There is a 4 cm x 6 cm swollen, erythematous, fluctuant, warm area to the dorsal aspect of the left forearm overlying the radius just proximal to the wrist.  She has good range of motion of the wrist with no evidence for septic arthritis.  Distal PMS is intact.  Nursing note and vitals reviewed.    ED Treatments / Results  Labs (all labs ordered are listed, but only abnormal results are displayed) Labs Reviewed  BASIC METABOLIC PANEL  CBC WITH DIFFERENTIAL/PLATELET  I-STAT BETA HCG BLOOD, ED ([facility], [facility],  [facility] ONLY)    EKG None  Radiology No results found.  Procedures Procedures (including critical care time)  Medications Ordered in ED Medications  povidone-iodine (BETADINE) 10 % ointment (  has no administration in time range)  clindamycin (CLEOCIN) IVPB 600 mg (has no administration in time range)  lidocaine (PF) (XYLOCAINE) 1 % injection 2 mL (2 mLs Intradermal Given 08/25/17 0128)  povidone-iodine (BETADINE) 10 % external solution (1 application  Given 08/25/17 0127)     Initial Impression / Assessment and Plan / ED Course  I have reviewed the triage vital signs and the nursing notes.  Pertinent labs & imaging results that were available during my care of the patient were reviewed by me and considered in my medical decision making (see chart for details).  INCISION AND DRAINAGE Performed by: Geoffery Lyons Consent: Verbal consent obtained. Risks and benefits: risks, benefits and alternatives were discussed Type: abscess  Body area: Left forearm  Anesthesia: local infiltration  Incision was made with a scalpel.  Local anesthetic: lidocaine 1 % without epinephrine  Anesthetic total: 2 ml  Complexity: complex Blunt dissection to break up loculations  Drainage: purulent  Drainage amount: Moderate  Packing material: No packing placed  Patient tolerance: Patient tolerated the procedure well with no immediate complications.   Patient with forearm abscess related to IV drug abuse.  This was incised and drained as described above.  She was given intravenous clindamycin and will be discharged with p.o. clindamycin for presumed MRSA infection.  To return as needed for any problems.  Final Clinical Impressions(s) / ED Diagnoses   Final diagnoses:  None    ED Discharge Orders    None       Geoffery Lyons, MD 08/25/17 [phone]

## 2017-08-25 NOTE — Discharge Instructions (Addendum)
Clindamycin as prescribed.  Ibuprofen 600 mg every 6 hours as needed for pain.  Local wound care with bacitracin and dressing changes twice daily.  Return to the emergency department for increased redness, swelling, pain, high fevers, or other new and concerning symptoms.

## 2017-09-20 ENCOUNTER — Other Ambulatory Visit: Payer: Self-pay

## 2017-09-20 ENCOUNTER — Encounter (HOSPITAL_COMMUNITY): Payer: Self-pay | Admitting: Emergency Medicine

## 2017-09-20 ENCOUNTER — Emergency Department (HOSPITAL_COMMUNITY)
Admission: EM | Admit: 2017-09-20 | Discharge: 2017-09-21 | Payer: Self-pay | Attending: Emergency Medicine | Admitting: Emergency Medicine

## 2017-09-20 DIAGNOSIS — Z79899 Other long term (current) drug therapy: Secondary | ICD-10-CM | POA: Insufficient documentation

## 2017-09-20 DIAGNOSIS — F1123 Opioid dependence with withdrawal: Secondary | ICD-10-CM | POA: Insufficient documentation

## 2017-09-20 DIAGNOSIS — F1193 Opioid use, unspecified with withdrawal: Secondary | ICD-10-CM

## 2017-09-20 DIAGNOSIS — J45909 Unspecified asthma, uncomplicated: Secondary | ICD-10-CM | POA: Insufficient documentation

## 2017-09-20 DIAGNOSIS — F17210 Nicotine dependence, cigarettes, uncomplicated: Secondary | ICD-10-CM | POA: Insufficient documentation

## 2017-09-20 NOTE — ED Triage Notes (Signed)
[facility] with c/o agitation and panic attack.. PT was dry heaving at the jail---- was given "narcan" by jail staff but then she started having extreme agitation and "panic behaviors".  Pt was given Versed 2.5 mg IV.  Pt reports that she is a daily user of heroin, and the last time she had one was 2 days ago.

## 2017-09-20 NOTE — ED Notes (Signed)
Bed: [id number] Expected date:  Expected time:  Means of arrival:  Comments: 27 yo female, heroin withdrawal

## 2017-09-21 ENCOUNTER — Emergency Department (HOSPITAL_COMMUNITY): Payer: Self-pay

## 2017-09-21 ENCOUNTER — Encounter (HOSPITAL_COMMUNITY): Payer: Self-pay

## 2017-09-21 ENCOUNTER — Emergency Department (HOSPITAL_COMMUNITY)
Admission: EM | Admit: 2017-09-21 | Discharge: 2017-09-21 | Disposition: A | Discharge status: Home / Self Care | Payer: Self-pay | Attending: Emergency Medicine | Admitting: Emergency Medicine

## 2017-09-21 ENCOUNTER — Other Ambulatory Visit: Payer: Self-pay

## 2017-09-21 DIAGNOSIS — F1910 Other psychoactive substance abuse, uncomplicated: Secondary | ICD-10-CM | POA: Insufficient documentation

## 2017-09-21 DIAGNOSIS — X838XXA Intentional self-harm by other specified means, initial encounter: Secondary | ICD-10-CM | POA: Insufficient documentation

## 2017-09-21 DIAGNOSIS — Y9389 Activity, other specified: Secondary | ICD-10-CM | POA: Insufficient documentation

## 2017-09-21 DIAGNOSIS — F329 Major depressive disorder, single episode, unspecified: Secondary | ICD-10-CM | POA: Insufficient documentation

## 2017-09-21 DIAGNOSIS — Y999 Unspecified external cause status: Secondary | ICD-10-CM | POA: Insufficient documentation

## 2017-09-21 DIAGNOSIS — R45851 Suicidal ideations: Secondary | ICD-10-CM | POA: Insufficient documentation

## 2017-09-21 DIAGNOSIS — M542 Cervicalgia: Secondary | ICD-10-CM | POA: Insufficient documentation

## 2017-09-21 DIAGNOSIS — Z046 Encounter for general psychiatric examination, requested by authority: Secondary | ICD-10-CM | POA: Insufficient documentation

## 2017-09-21 DIAGNOSIS — Y92143 Cell of prison as the place of occurrence of the external cause: Secondary | ICD-10-CM | POA: Insufficient documentation

## 2017-09-21 DIAGNOSIS — F17210 Nicotine dependence, cigarettes, uncomplicated: Secondary | ICD-10-CM | POA: Insufficient documentation

## 2017-09-21 DIAGNOSIS — J45909 Unspecified asthma, uncomplicated: Secondary | ICD-10-CM | POA: Insufficient documentation

## 2017-09-21 DIAGNOSIS — F419 Anxiety disorder, unspecified: Secondary | ICD-10-CM | POA: Insufficient documentation

## 2017-09-21 DIAGNOSIS — T1491XA Suicide attempt, initial encounter: Secondary | ICD-10-CM | POA: Insufficient documentation

## 2017-09-21 LAB — COMPREHENSIVE METABOLIC PANEL
ALBUMIN: 3.7 g/dL (ref 3.5–5.0)
ALBUMIN: 3.9 g/dL (ref 3.5–5.0)
ALK PHOS: 106 U/L (ref 38–126)
ALK PHOS: 120 U/L (ref 38–126)
ALT: 28 U/L (ref 14–54)
ALT: 31 U/L (ref 14–54)
ANION GAP: 11 (ref 5–15)
AST: 32 U/L (ref 15–41)
AST: 41 U/L (ref 15–41)
Anion gap: 8 (ref 5–15)
BUN: 12 mg/dL (ref 6–20)
BUN: 13 mg/dL (ref 6–20)
CALCIUM: 8.9 mg/dL (ref 8.9–10.3)
CALCIUM: 9.8 mg/dL (ref 8.9–10.3)
CO2: 22 mmol/L (ref 22–32)
CO2: 24 mmol/L (ref 22–32)
CREATININE: 0.89 mg/dL (ref 0.44–1.00)
Chloride: 111 mmol/L (ref 101–111)
Chloride: 105 mmol/L (ref 101–111)
Creatinine, Ser: 0.87 mg/dL (ref 0.44–1.00)
GFR calc Af Amer: >60 mL/min (ref >60)
GFR calc Af Amer: >60 mL/min (ref >60)
GFR calc non Af Amer: >60 mL/min (ref >60)
GFR calc non Af Amer: >60 mL/min (ref >60)
GLUCOSE: 107 mg/dL — AB (ref 65–99)
GLUCOSE: 98 mg/dL (ref 65–99)
POTASSIUM: 4.6 mmol/L (ref 3.5–5.1)
Potassium: 4.5 mmol/L (ref 3.5–5.1)
SODIUM: 140 mmol/L (ref 135–145)
SODIUM: 141 mmol/L (ref 135–145)
Total Bilirubin: 0.6 mg/dL (ref 0.3–1.2)
Total Bilirubin: 0.6 mg/dL (ref 0.3–1.2)
Total Protein: 7.6 g/dL (ref 6.5–8.1)
Total Protein: 7.9 g/dL (ref 6.5–8.1)

## 2017-09-21 LAB — CBC WITH DIFFERENTIAL/PLATELET
BASOS ABS: 0 10*3/uL (ref 0.0–0.1)
Basophils Absolute: 0 10*3/uL (ref 0.0–0.1)
Basophils Relative: 0 %
Basophils Relative: 0 %
EOS ABS: 0 10*3/uL (ref 0.0–0.7)
Eosinophils Absolute: 0 10*3/uL (ref 0.0–0.7)
Eosinophils Relative: 0 %
Eosinophils Relative: 0 %
HCT: 36.6 % (ref 36.0–46.0)
HCT: 38.3 % (ref 36.0–46.0)
HEMOGLOBIN: 11.8 g/dL — AB (ref 12.0–15.0)
Hemoglobin: 12 g/dL (ref 12.0–15.0)
LYMPHS ABS: 4.7 10*3/uL — AB (ref 0.7–4.0)
LYMPHS ABS: 4.9 10*3/uL — AB (ref 0.7–4.0)
Lymphocytes Relative: 44 %
Lymphocytes Relative: 47 %
MCH: 27.5 pg (ref 26.0–34.0)
MCH: 28.6 pg (ref 26.0–34.0)
MCHC: 31.3 g/dL (ref 30.0–36.0)
MCHC: 32.2 g/dL (ref 30.0–36.0)
MCV: 87.6 fL (ref 78.0–100.0)
MCV: 88.6 fL (ref 78.0–100.0)
MONO ABS: 0.9 10*3/uL (ref 0.1–1.0)
MONOS PCT: 9 %
Monocytes Absolute: 0.9 10*3/uL (ref 0.1–1.0)
Monocytes Relative: 8 %
NEUTROS ABS: 5.4 10*3/uL (ref 1.7–7.7)
Neutro Abs: 4.4 10*3/uL (ref 1.7–7.7)
Neutrophils Relative %: 44 %
Neutrophils Relative %: 48 %
PLATELETS: 343 10*3/uL (ref 150–400)
Platelets: 330 10*3/uL (ref 150–400)
RBC: 4.13 MIL/uL (ref 3.87–5.11)
RBC: 4.37 MIL/uL (ref 3.87–5.11)
RDW: 16.2 % — AB (ref 11.5–15.5)
RDW: 16.4 % — ABNORMAL HIGH (ref 11.5–15.5)
WBC: 10.1 10*3/uL (ref 4.0–10.5)
WBC: 11.2 10*3/uL — ABNORMAL HIGH (ref 4.0–10.5)

## 2017-09-21 LAB — RAPID URINE DRUG SCREEN, HOSP PERFORMED
Amphetamines: POSITIVE — AB
BARBITURATES: POSITIVE — AB
BENZODIAZEPINES: POSITIVE — AB
COCAINE: NOT DETECTED
Opiates: NOT DETECTED
TETRAHYDROCANNABINOL: POSITIVE — AB

## 2017-09-21 LAB — SALICYLATE LEVEL: Salicylate Lvl: <7 mg/dL (ref 2.8–30.0)

## 2017-09-21 LAB — ETHANOL
Alcohol, Ethyl (B): <10 mg/dL (ref <10)
Alcohol, Ethyl (B): <10 mg/dL (ref <10)

## 2017-09-21 LAB — I-STAT BETA HCG BLOOD, ED (MC, WL, AP ONLY)

## 2017-09-21 LAB — ACETAMINOPHEN LEVEL: Acetaminophen (Tylenol), Serum: <10 ug/mL — ABNORMAL LOW (ref 10–30)

## 2017-09-21 LAB — HCG, SERUM, QUALITATIVE: Preg, Serum: NEGATIVE

## 2017-09-21 MED ORDER — DICYCLOMINE HCL 20 MG PO TABS: 20.0000 mg | ORAL_TABLET | Freq: Four times a day (QID) | ORAL | Status: DC | PRN

## 2017-09-21 MED ORDER — ONDANSETRON 4 MG PO TBDP
4.0000 mg | ORAL_TABLET | Freq: Four times a day (QID) | ORAL | Status: DC | PRN
  Administered 2017-09-21: 4 mg via ORAL
  Filled 2017-09-21: qty 1

## 2017-09-21 MED ORDER — ONDANSETRON HCL 4 MG/2ML IJ SOLN
4.0000 mg | Freq: Once | INTRAMUSCULAR | Status: DC
  Filled 2017-09-21: qty 2

## 2017-09-21 MED ORDER — DIPHENHYDRAMINE HCL 50 MG/ML IJ SOLN
25.0000 mg | Freq: Once | INTRAMUSCULAR | Status: AC
  Administered 2017-09-21: 25 mg via INTRAVENOUS
  Filled 2017-09-21: qty 1

## 2017-09-21 MED ORDER — CLONIDINE HCL 0.2 MG PO TABS: 0.1000 mg | ORAL_TABLET | ORAL | Status: DC

## 2017-09-21 MED ORDER — LOPERAMIDE HCL 2 MG PO CAPS: 2.0000 mg | ORAL_CAPSULE | ORAL | Status: DC | PRN

## 2017-09-21 MED ORDER — IOPAMIDOL (ISOVUE-370) INJECTION 76%
INTRAVENOUS | Status: AC
  Filled 2017-09-21: qty 50

## 2017-09-21 MED ORDER — CLONIDINE HCL 0.2 MG PO TABS: 0.1000 mg | ORAL_TABLET | Freq: Four times a day (QID) | ORAL | Status: DC

## 2017-09-21 MED ORDER — CLONIDINE HCL 0.2 MG PO TABS: 0.1000 mg | ORAL_TABLET | Freq: Every day | ORAL | Status: DC

## 2017-09-21 MED ORDER — KETOROLAC TROMETHAMINE 30 MG/ML IJ SOLN
30.0000 mg | Freq: Once | INTRAMUSCULAR | Status: AC
  Administered 2017-09-21: 30 mg via INTRAVENOUS
  Filled 2017-09-21: qty 1

## 2017-09-21 MED ORDER — CLONIDINE HCL 0.1 MG PO TABS
0.1000 mg | ORAL_TABLET | Freq: Once | ORAL | Status: AC
  Administered 2017-09-21: 0.1 mg via ORAL
  Filled 2017-09-21: qty 1

## 2017-09-21 MED ORDER — IOPAMIDOL (ISOVUE-370) INJECTION 76%
50.0000 mL | Freq: Once | INTRAVENOUS | Status: AC | PRN
  Administered 2017-09-21: 50 mL via INTRAVENOUS

## 2017-09-21 MED ORDER — NAPROXEN 250 MG PO TABS: 500.0000 mg | ORAL_TABLET | Freq: Two times a day (BID) | ORAL | Status: DC | PRN

## 2017-09-21 MED ORDER — DICYCLOMINE HCL 10 MG/ML IM SOLN
20.0000 mg | Freq: Once | INTRAMUSCULAR | Status: AC
  Administered 2017-09-21: 20 mg via INTRAMUSCULAR
  Filled 2017-09-21: qty 2

## 2017-09-21 MED ORDER — METHOCARBAMOL 500 MG PO TABS
500.0000 mg | ORAL_TABLET | Freq: Three times a day (TID) | ORAL | Status: DC | PRN
  Administered 2017-09-21: 500 mg via ORAL
  Filled 2017-09-21: qty 1

## 2017-09-21 MED ORDER — HYDROXYZINE HCL 25 MG PO TABS
25.0000 mg | ORAL_TABLET | Freq: Four times a day (QID) | ORAL | Status: DC | PRN
Start: 2017-09-21 — End: 2017-09-21
  Administered 2017-09-21: 25 mg via ORAL
  Filled 2017-09-21: qty 1

## 2017-09-21 NOTE — ED Notes (Signed)
Regular Diet ordered for Dinner. 

## 2017-09-21 NOTE — Progress Notes (Signed)
Patient is evaluated by me via tele-psych and is cleared by psychiatry after consult with Dr. Lucianne Muss.  Patient denies any thoughts of self-harm and again denies any SI/HI.  She does  report that she has had some hallucinations in the past when she is went through detox.  Her last detox reported was more than 2 years ago and her longest sobriety was 2-3 months.  She reports that 3 days ago was her last use and she reported using Adderall, Xanax, Klonopin, marijuana, fentanyl, alcohol, and heroin.  Patient admits that she does not feel that she is psychotic.

## 2017-09-21 NOTE — BH Assessment (Signed)
Assessment Note  Mary [name] is an 27 y.o. female., who was brought [facility] by [facility].  Patient was found in her jail cell hanging by a tourniquet around her neck.   Patient presented orientated x4, mood "depressed and anxious" affect congruent with mood.  Patient denied current SI and HI.  Patient reports a voice told her to hang herself so that she does not experience the pain of withdrawal from opiates.  She also reports seeing crawling bugs, clock bending, and wall moving in the hospital room, as she continues to withdraw from opiates.  Patient reports being informally told by a Doctor that she has Schizophrenia, however she was never formally diagnosed.  Patient reports experiencing a suicide attempt at age 36 years because of depression and Anorexia.  Patient reports using opiates for the past 6 years.  She reports using IV heroin and opana of 1/2 gram 2 to 3x per day.  Patient reports last opiate use was 09-19-2017.  Patient's UDS was also positive for Cannabis, Benzo, and amphetamines.  Patient was also brought to [facility] yesterday after narcan was administered and she experienced a panic attack.  Patient was discharged the same day.    Patient does not meet inpatient criteria and will be returned to jail.  PA-C Sharen Heck provided Patient's disposition.         Diagnosis:  Opioid Use Disorder, severe; Major Depressive Disorder, recurrent, moderate  Past Medical History:  Past Medical History:  Diagnosis Date  . Abdominal cramps 01/25/2015  . Asthma   . Back pain   . Chlamydia   . Depression   . Dysmenorrhea 06/04/2013  . Herpes   . IBS (irritable bowel syndrome)   . Lymph nodes enlarged 06/04/2013   Bilateral in groin L>R +trich  . Migraine   . Missed periods 01/25/2015  . Trichomonal vaginitis 06/04/2013    Past Surgical History:  Procedure Laterality Date  . TONSILLECTOMY      Family History:  Family History  Problem Relation Age of Onset  . Depression Mother    . Anxiety disorder Mother   . Depression Father   . Alcohol abuse Father   . Other Sister        was in MVA  . Anxiety disorder Sister   . Other Brother        heart condition  . Depression Brother   . Anxiety disorder Brother   . Depression Sister   . Anxiety disorder Sister   . Depression Sister   . Anxiety disorder Sister   . Depression Brother   . Anxiety disorder Brother   . Obesity Brother   . Depression Brother   . Anxiety disorder Brother   . Depression Brother   . Anxiety disorder Brother   . Depression Brother   . Anxiety disorder Brother   . Depression Brother   . Anxiety disorder Brother   . Depression Brother   . Anxiety disorder Brother   . Depression Brother   . Anxiety disorder Brother   . Other Paternal Grandmother        heart stopped  . Dementia Maternal Grandmother   . Heart disease Maternal Grandmother   . Hypertension Maternal Grandmother   . Other Brother        was in MVA    Social History:  reports that she has been smoking cigarettes.  She has a 7.00 pack-year smoking history. She has never used smokeless tobacco. She reports that she drinks  alcohol. She reports that she has current or past drug history. Drug: Marijuana.  Additional Social History:  Substance #1 Name of Substance 1: Opiates (IV heroin, opana) 1 - Amount (size/oz): 1/2 gram  1 - Frequency: 2 to 3x daily Substance #2 Name of Substance 2: Cannabis 2 - Frequency: UDS positive Substance #3 Name of Substance 3: Amphetamines 3 - Amount (size/oz): UDS positive  CIWA: CIWA-Ar BP: 115/71 Pulse Rate: (!) 110 COWS: Clinical Opiate Withdrawal Scale (COWS) Resting Pulse Rate: Pulse Rate 101-120 Sweating: Subjective report of chills or flushing Restlessness: Unable to sit still for more than a few seconds Pupil Size: Pupils moderately dilated Bone or Joint Aches: Patient reports sever diffuse aching of joints/muscles Runny Nose or Tearing: Not present GI Upset: nausea or loose  stool Tremor: Slight tremor observable Yawning: No yawning Anxiety or Irritability: Patient so irritable or anxious that participation in the assessment is difficult Gooseflesh Skin: Skin is smooth COWS Total Score: 20  Allergies:  Allergies  Allergen Reactions  . Hydrocodone Hives  . Raspberry Itching  . Tramadol     abd cramping   . Other Rash    Lever 2000 soap    Home Medications:  (Not in a hospital admission)  OB/GYN Status:  No LMP recorded.  General Assessment Data Location of Assessment: [facility] ED TTS Assessment: In system Is this a Tele or Face-to-Face Assessment?: Tele Assessment Is this an Initial Assessment or a Re-assessment for this encounter?: Initial Assessment Marital status: Single Maiden name: Foody Is patient pregnant?: Unknown Pregnancy Status: Unknown Living Arrangements: Other (Comment)(Currently in [place]) Can pt return to current living arrangement?: Yes Admission Status: Voluntary Is patient capable of signing voluntary admission?: Yes Referral Source: Other(Jail) Insurance type: None  Medical Screening Exam [facility] Walk-in ONLY) Medical Exam completed: Yes  Crisis Care Plan Living Arrangements: Other (Comment)(Currently in [place]) Name of Psychiatrist: None Name of Therapist: None  Education Status Is patient currently in school?: No Is the patient employed, unemployed or receiving disability?: Unemployed  Risk to self with the past 6 months Suicidal Ideation: No-Not Currently/Within Last 6 Months Has patient been a risk to self within the past 6 months prior to admission? : Yes Suicidal Intent: No-Not Currently/Within Last 6 Months Has patient had any suicidal intent within the past 6 months prior to admission? : Yes Is patient at risk for suicide?: Yes Suicidal Plan?: No-Not Currently/Within Last 6 Months Has patient had any suicidal plan within the past 6 months prior to admission? : Yes Access to Means: No What has been your use of  drugs/alcohol within the last 12 months?: opiates, THC, amphetamines Previous Attempts/Gestures: Yes How many times?: 1 Other Self Harm Risks: None Triggers for Past Attempts: Other (Comment)(Depression and eating disorder) Intentional Self Injurious Behavior: None Family Suicide History: Unknown Recent stressful life event(s): Other (Comment)(currently in jail) Persecutory voices/beliefs?: Yes Depression: Yes Depression Symptoms: Feeling angry/irritable, Feeling worthless/self pity, Despondent Substance abuse history and/or treatment for substance abuse?: Yes Suicide prevention information given to non-admitted patients: Not applicable  Risk to Others within the past 6 months Homicidal Ideation: No Does patient have any lifetime risk of violence toward others beyond the six months prior to admission? : Unknown Thoughts of Harm to Others: No Current Homicidal Intent: No Current Homicidal Plan: No Access to Homicidal Means: No Identified Victim: N/A History of harm to others?: No Assessment of Violence: None Noted Violent Behavior Description: N/A Does patient have access to weapons?: No Criminal Charges Pending?: Yes Describe  Pending Criminal Charges: Currently in jail for trespassing and lacareny Does patient have a court date: No Is patient on probation?: No  Psychosis Hallucinations: Auditory, Visual Delusions: None noted  Mental Status Report Appearance/Hygiene: In hospital gown Eye Contact: Good Motor Activity: Unremarkable Speech: Logical/coherent Level of Consciousness: Alert Mood: Depressed Affect: Anxious, Depressed Anxiety Level: Moderate Thought Processes: Coherent, Relevant Judgement: Impaired Orientation: Person, Place, Time, Situation Obsessive Compulsive Thoughts/Behaviors: None  Cognitive Functioning Concentration: Good Memory: Recent Intact, Remote Intact Is patient IDD: No Is patient DD?: No Insight: Poor Impulse Control: Poor Appetite:  Fair Have you had any weight changes? : No Change Sleep: Decreased Total Hours of Sleep: 5 Vegetative Symptoms: None  ADLScreening [facility] Assessment Services) Patient's cognitive ability adequate to safely complete daily activities?: Yes Patient able to express need for assistance with ADLs?: Yes Independently performs ADLs?: Yes (appropriate for developmental age)  Prior Inpatient Therapy Prior Inpatient Therapy: Yes Prior Therapy Dates: Age 53 years  Prior Therapy Facilty/Provider(s): Unknown Reason for Treatment: SI attempt  Prior Outpatient Therapy Prior Outpatient Therapy: No Does patient have an ACCT team?: No Does patient have Intensive In-House Services?  : No Does patient have [facility] services? : No Does patient have P4CC services?: No  ADL Screening (condition at time of admission) Patient's cognitive ability adequate to safely complete daily activities?: Yes Is the patient deaf or have difficulty hearing?: No Does the patient have difficulty seeing, even when wearing glasses/contacts?: No Does the patient have difficulty concentrating, remembering, or making decisions?: No Patient able to express need for assistance with ADLs?: Yes Does the patient have difficulty dressing or bathing?: No Independently performs ADLs?: Yes (appropriate for developmental age) Does the patient have difficulty walking or climbing stairs?: No Weakness of Legs: None Weakness of Arms/Hands: None             Advance Directives (For Healthcare) Does Patient Have a Medical Advance Directive?: No(Patient in custody) Would patient like information on creating a medical advance directive?: No - Patient declined    Additional Information 1:1 In Past 12 Months?: Yes CIRT Risk: No Elopement Risk: Yes Does patient have medical clearance?: Yes     Disposition:  Disposition Initial Assessment Completed for this Encounter: Yes Disposition of Patient: Discharge Patient refused  recommended treatment: No Mode of transportation if patient is discharged?: Other(Patient in custody) Patient referred to: Other (Comment)(Back to [facility])  On Site Evaluation by:   Reviewed with Physician:    Dey-Johnson,Rindi Beechy 09/21/2017 6:29 PM

## 2017-09-21 NOTE — ED Notes (Addendum)
Signature pad not working. D/C instructions given to deputy. Pt voiced understanding.

## 2017-09-21 NOTE — ED Provider Notes (Addendum)
[name] Provider Note   CSN: [id number] Arrival date & time: 09/21/17  1323     History   Chief Complaint Chief Complaint  Patient presents with  . Suicidal    HPI Mary [name] is a 27 y.o. female with h/o polysubstance abuse  who attempted to strangle herself while in jail.  History is obtained by triage note, patient and phone conversation with Mary [name].  Patient states that she is currently withdrawing from heroin, fentanyl, Xanax, alcohol, cigarettes for the last 3 days while in custody.  She reports generalized body pain, nausea, diarrhea, sensation of bugs crawling on her skin, restlessness.  States today she could not take it anymore and just wanted it to and so she wrapped something around her neck twice and started pulling on it.  Mary [name] who found her states patient was laying on the ground on her back  back with a tourniquet placed around her neck she was pulling on with her left arm, he noted slight blue/purple discoloration to her lips.  Patient was still spontaneously moving all extremities, there was no observed loss of consciousness.  Pt brought in to ED for evaluation. Pt was in ED just last night and treated for withdrawal symptoms with clonidine.   HPI  Past Medical History:  Diagnosis Date  . Abdominal cramps 01/25/2015  . Asthma   . Back pain   . Chlamydia   . Depression   . Dysmenorrhea 06/04/2013  . Herpes   . IBS (irritable bowel syndrome)   . Lymph nodes enlarged 06/04/2013   Bilateral in groin L>R +trich  . Migraine   . Missed periods 01/25/2015  . Trichomonal vaginitis 06/04/2013    Patient Active Problem List   Diagnosis Date Noted  . Herpes 01/25/2015  . Missed periods 01/25/2015  . Abdominal cramps 01/25/2015  . 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 Chlamydia infection 06/05/2013  . Vaginal discharge 06/04/2013  . Trichomonal vaginitis 06/04/2013  . Lymph nodes enlarged  06/04/2013  . Dysmenorrhea 06/04/2013    Past Surgical History:  Procedure Laterality Date  . TONSILLECTOMY       OB History    Gravida  3   Para      Term      Preterm      AB  3   Living        SAB  3   TAB      Ectopic      Multiple      Live Births               Home Medications    Prior to Admission medications   Medication Sig Start Date End Date Taking? Authorizing Provider  albuterol (PROVENTIL HFA;VENTOLIN HFA) 108 (90 BASE) MCG/ACT inhaler Inhale 2 puffs into the lungs every 4 (four) hours as needed for wheezing or shortness of breath. 07/12/13  Yes Pollina, Canary Brim, MD  medroxyPROGESTERone (PROVERA) 10 MG tablet Take 1 tablet (10 mg total) by mouth daily. Patient not taking: Reported on 09/20/2017 01/25/15   Mary [name] A, NP  meloxicam (MOBIC) 7.5 MG tablet Take 1-2 tablets (7.5-15 mg total) by mouth daily. Patient not taking: Reported on 09/20/2017 01/12/16   Burgess Amor, PA-C  naproxen sodium (ANAPROX) 550 MG tablet Take 1 tablet (550 mg total) by mouth 2 (two) times daily with a meal. Patient not taking: Reported on 09/20/2017 01/25/15   Mary Potter, NP  valACYclovir [name])  1000 MG tablet Take 1 daily bid for 5 days then 1 daily Patient not taking: Reported on 09/20/2017 01/25/15   Mary Potter, NP    Family History Family History  Problem Relation Age of Onset  . Depression Mother   . Anxiety disorder Mother   . Depression Father   . Alcohol abuse Father   . Other Sister        was in MVA  . Anxiety disorder Sister   . Other Brother        heart condition  . Depression Brother   . Anxiety disorder Brother   . Depression Sister   . Anxiety disorder Sister   . Depression Sister   . Anxiety disorder Sister   . Depression Brother   . Anxiety disorder Brother   . Obesity Brother   . Depression Brother   . Anxiety disorder Brother   . Depression Brother   . Anxiety disorder Brother   . Depression Brother     . Anxiety disorder Brother   . Depression Brother   . Anxiety disorder Brother   . Depression Brother   . Anxiety disorder Brother   . Depression Brother   . Anxiety disorder Brother   . Other Paternal Grandmother        heart stopped  . Dementia Maternal Grandmother   . Heart disease Maternal Grandmother   . Hypertension Maternal Grandmother   . Other Brother        was in MVA    Social History Social History   Tobacco Use  . Smoking status: Current Every Day Smoker    Packs/day: 1.00    Years: 7.00    Pack years: 7.00    Types: Cigarettes  . Smokeless tobacco: Never Used  Substance Use Topics  . Alcohol use: Yes  . Drug use: Yes    Types: Marijuana    Comment: two months ago, pills     Allergies   Hydrocodone; Raspberry; Tramadol; and Other   Review of Systems Review of Systems  Gastrointestinal: Positive for diarrhea and nausea.  Musculoskeletal: Positive for myalgias (generalized).  Psychiatric/Behavioral: Positive for behavioral problems, self-injury and suicidal ideas. The patient is nervous/anxious.   All other systems reviewed and are negative.    Physical Exam Updated Vital Signs BP 116/76 (BP Location: Right Arm)   Pulse 85   Temp 98 F (36.7 C) (Oral)   Resp 20   Wt 49.9 kg (110 lb)   SpO2 100%   BMI 21.48 kg/m   Physical Exam  Constitutional: She is oriented to person, place, and time. She appears well-developed and well-nourished. No distress.  Mild distress, moaning in bed.   HENT:  Head: Normocephalic and atraumatic.  Nose: Nose normal.  Mouth/Throat: No oropharyngeal exudate.  Poor dentition. Moist mucous membranes. Normal phonation. No stridor. Controlling oral secretions.   Eyes: Pupils are equal, round, and reactive to light. Conjunctivae and EOM are normal.  Neck: Normal range of motion.  No signs of skin injury such as ecchymosis, erythema, edema, abrasions or laceration to neck.  Diffuse posterior cervical tenderness midline  and paraspinal musculature.  No cervical collar in place. She is moving around in bed without obvious deficits or deformity of neck during exam. Trachea midline.   Cardiovascular: Normal rate, regular rhythm and intact distal pulses.  No murmur heard. No carotid bruits bilaterally. 2+ DP and radial pulses bilaterally. No LE edema.   Pulmonary/Chest: Effort normal and breath sounds normal. No respiratory  distress. She has no wheezes. She has no rales.  Abdominal: Soft. Bowel sounds are normal. There is tenderness.  Generalized abdominal tenderness. Negative Murphys and Mcburney's. No G/R/R. No suprapubic or CVA tenderness.   Musculoskeletal: Normal range of motion. She exhibits no deformity.  Neurological: She is alert and oriented to person, place, and time.  Skin: Skin is warm and dry. Capillary refill takes less than 2 seconds.  Psychiatric: She has a normal mood and affect. Her behavior is normal. Judgment and thought content normal.  Nursing note and vitals reviewed.    ED Treatments / Results  Labs (all labs ordered are listed, but only abnormal results are displayed) Labs Reviewed  COMPREHENSIVE METABOLIC PANEL - Abnormal; Notable for the following components:      Result Value   Glucose, Bld 107 (*)    All other components within normal limits  RAPID URINE DRUG SCREEN, HOSP PERFORMED - Abnormal; Notable for the following components:   Benzodiazepines POSITIVE (*)    Amphetamines POSITIVE (*)    Tetrahydrocannabinol POSITIVE (*)    Barbiturates POSITIVE (*)    All other components within normal limits  CBC WITH DIFFERENTIAL/PLATELET - Abnormal; Notable for the following components:   WBC 11.2 (*)    RDW 16.2 (*)    Lymphs Abs 4.9 (*)    All other components within normal limits  ACETAMINOPHEN LEVEL - Abnormal; Notable for the following components:   Acetaminophen (Tylenol), Serum <10 (*)    All other components within normal limits  ETHANOL  SALICYLATE LEVEL  I-STAT BETA  HCG BLOOD, ED ([facility], [facility], [facility] ONLY)    EKG EKG Interpretation  Date/Time:  Saturday September 21 2017 14:48:26 EDT Ventricular Rate:  90 PR Interval:    QRS Duration: 62 QT Interval:  355 QTC Calculation: 435 R Axis:   62 Text Interpretation:  Sinus rhythm Abnormal Q suggests anterior infarct Nonspecific repol abnormality, inferior leads ST elevation, consider lateral injury Interpretation limited secondary to artifact Confirmed by Vanetta Mulders [phone]) on 09/21/2017 5:16:18 PM   Radiology Ct Angio Neck W And/or Wo Contrast  Result Date: 09/21/2017 CLINICAL DATA:  Attempted suicide by strangulation. EXAM: CT ANGIOGRAPHY NECK TECHNIQUE: Multidetector CT imaging of the neck was performed using the standard protocol during bolus administration of intravenous contrast. Multiplanar CT image reconstructions and MIPs were obtained to evaluate the vascular anatomy. Carotid stenosis measurements (when applicable) are obtained utilizing NASCET criteria, using the distal internal carotid diameter as the denominator. CONTRAST:  50mL ISOVUE-370 IOPAMIDOL (ISOVUE-370) INJECTION 76% COMPARISON:  None. FINDINGS: Aortic arch: Normal Right carotid system: Vessels are smooth and widely patent. Left carotid system: Vessels are smooth and widely patent. Vertebral arteries: Vessels are smooth and widely patent. Skeleton: No evidence of bony or cartilaginous fracture. Multiple dental cavities. Other neck: No visible hematoma. There is lingual tonsil thickening somewhat eccentric to the right. No submucosal edema is seen. Mucous retention cysts in the left maxillary sinus. Upper chest: Negative.  No visible air leak IMPRESSION: Negative neck CTA.  No evidence of injury. Electronically Signed   By: Marnee Spring M.D.   On: 09/21/2017 17:04    Procedures Procedures (including critical care time)  Medications Ordered in ED Medications  iopamidol (ISOVUE-370) 76 % injection (has no administration in time range)    dicyclomine (BENTYL) tablet 20 mg (has no administration in time range)  hydrOXYzine (ATARAX/VISTARIL) tablet 25 mg (has no administration in time range)  loperamide (IMODIUM) capsule 2-4 mg (has no administration in time  range)  methocarbamol (ROBAXIN) tablet 500 mg (has no administration in time range)  naproxen (NAPROSYN) tablet 500 mg (has no administration in time range)  ondansetron (ZOFRAN-ODT) disintegrating tablet 4 mg (has no administration in time range)  cloNIDine (CATAPRES) tablet 0.1 mg (0.1 mg Oral Not Given 09/21/17 1800)    Followed by  cloNIDine (CATAPRES) tablet 0.1 mg (has no administration in time range)    Followed by  cloNIDine (CATAPRES) tablet 0.1 mg (has no administration in time range)  ketorolac (TORADOL) 30 MG/ML injection 30 mg (30 mg Intravenous Given 09/21/17 1720)  iopamidol (ISOVUE-370) 76 % injection 50 mL (50 mLs Intravenous Contrast Given 09/21/17 1642)  dicyclomine (BENTYL) injection 20 mg (20 mg Intramuscular Given 09/21/17 1721)  diphenhydrAMINE (BENADRYL) injection 25 mg (25 mg Intravenous Given 09/21/17 1721)     Initial Impression / Assessment and Plan / ED Course  I have reviewed the triage vital signs and the nursing notes.  Pertinent labs & imaging results that were available during my care of the patient were reviewed by me and considered in my medical decision making (see chart for details).  Clinical Course as of Sep 21 1899  Sat Sep 21, 2017  1853 Re-evaluated patient. She is eating sandwich, sitting up, talking to [facility] in no distress. No shakes, pain, nausea, abdominal pain.  States she feels better after medicine. She denies Si, Hi, AVH today. States she strangled herself because she was tired of withdrawals but did not want to kill herself.  States she was in ED yesterday and took an IV orange elastic and used this to try to strangle herself in jail.  Discussed pt is cleared medically and psych with sheriff who will take pt back to  [employer].    [CG]    Clinical Course User Index [CG] Liberty Handy, PA-C   27 year old with history of polysubstance abuse currently under custody of Sheriff brought in for attempted strangulation in jail. Patient states she is experiencing multiple withdrawal symptoms from multiple substances and "just wanted it to end".  Last use 3 days ago. Patient was in the ED last night for erratic behavior, she was treated for withdrawal symptoms.  On exam she is tachycardic, restless but no obvious signs of neck trauma. She is requesting food and something for withdrawal symptoms.  Initial COWS score = 18.   1725: Labs, EKG, CT angio of neck reviewed, remarkable for mild leukocytosis WBC 11.2 Urine positive for benzodiazepines, and vitamins, THC, barbiturates. CT angiogram unremarkable. Pt was given Toradol, Benadryl, Bentyl for withdrawal symptoms. COWS protocol ordered by RN.  Awaiting TTS/psych eval. Pt discussed with Dr. Deretha Emory. Final Clinical Impressions(s) / ED Diagnoses   1855: Ivy from TTS notified me pt has been evaluated by psychiatrist (Dr. Lucianne Muss) and cleared by psych to return to jail. I reevaluated patient and she denies active SI, HI, ADH. She feels much better after medicine. Tachycardia has resolved. COWS score = 3. From withdrawal stand point I think patient is safe for dc.  She is ambulatory, tolerating food/fluids in the ED.  Final diagnoses:  Suicidal behavior with attempted self-injury [facility])  Polysubstance abuse [facility])    ED Discharge Orders    None       Jerrell Mylar 09/21/17 1901    Vanetta Mulders, MD 09/22/17 2034

## 2017-09-21 NOTE — ED Notes (Signed)
ED Provider at bedside. 

## 2017-09-21 NOTE — ED Notes (Signed)
Telepsych being performed. 

## 2017-09-21 NOTE — Discharge Instructions (Addendum)
You were cleared by psychiatry team to return to jail under suicide watch.  Withdrawal symptoms were treated in the emergency department.

## 2017-09-21 NOTE — ED Triage Notes (Signed)
Pt BIB sheriff, from jail for SI attempt. Pt was found with a tourniquet around her neck, witness reports the pts face was purple. No marks noted to pts neck. Skin dry and inatact. Pt arrives to ED a.o, distressed due to withdrawal from heroin, fentanyl, xanax and alcohol.

## 2017-09-21 NOTE — ED Notes (Signed)
Pt arrived to [facility] via stretcher. Deputy from jail w/pt - pt noted to be wearing jail attire - pt noted to be in forensic restraints - bil wrists and bil feet. Pt alert, oriented, cooperative. States she is hungry - food ordered. Pt and Deputy aware of tx plan.

## 2017-09-21 NOTE — ED Notes (Signed)
Pt eating [place] sandwich d/t did not like lasagna given.

## 2017-09-21 NOTE — ED Provider Notes (Signed)
[facility]-EMERGENCY DEPT Provider Note   CSN: [id number] Arrival date & time: 09/20/17  2156     History   Chief Complaint Chief Complaint  Patient presents with  . Heroin Withdrawal    HPI Mary [name] is a 27 y.o. female.  This patient is a 27 year old female with past medical history of polysubstance abuse.  She was brought by jail security for evaluation of erratic behavior.  She states that she became very agitated, shaky, and hyperventilating in the jail.  She tells me she last used heroin 2 days ago, alcohol yesterday, and Xanax yesterday.  She reports she is a daily abuser of Xanax and heroin.  The history is provided by the patient.    Past Medical History:  Diagnosis Date  . Abdominal cramps 01/25/2015  . Asthma   . Back pain   . Chlamydia   . Depression   . Dysmenorrhea 06/04/2013  . Herpes   . IBS (irritable bowel syndrome)   . Lymph nodes enlarged 06/04/2013   Bilateral in groin L>R +trich  . Migraine   . Missed periods 01/25/2015  . Trichomonal vaginitis 06/04/2013    Patient Active Problem List   Diagnosis Date Noted  . Herpes 01/25/2015  . Missed periods 01/25/2015  . Abdominal cramps 01/25/2015  . 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 Chlamydia infection 06/05/2013  . Vaginal discharge 06/04/2013  . Trichomonal vaginitis 06/04/2013  . Lymph nodes enlarged 06/04/2013  . Dysmenorrhea 06/04/2013    Past Surgical History:  Procedure Laterality Date  . TONSILLECTOMY       OB History    Gravida  3   Para      Term      Preterm      AB  3   Living        SAB  3   TAB      Ectopic      Multiple      Live Births               Home Medications    Prior to Admission medications   Medication Sig Start Date End Date Taking? Authorizing Provider  albuterol (PROVENTIL HFA;VENTOLIN HFA) 108 (90 BASE) MCG/ACT inhaler Inhale 2 puffs into the lungs every 4 (four) hours as needed for  wheezing or shortness of breath. Patient not taking: Reported on 01/25/2015 07/12/13   Gilda Crease, MD  medroxyPROGESTERone (PROVERA) 10 MG tablet Take 1 tablet (10 mg total) by mouth daily. Patient not taking: Reported on 09/20/2017 01/25/15   [name] A, NP  meloxicam (MOBIC) 7.5 MG tablet Take 1-2 tablets (7.5-15 mg total) by mouth daily. Patient not taking: Reported on 09/20/2017 01/12/16   Burgess Amor, PA-C  naproxen sodium (ANAPROX) 550 MG tablet Take 1 tablet (550 mg total) by mouth 2 (two) times daily with a meal. Patient not taking: Reported on 09/20/2017 01/25/15   [name], NP  valACYclovir (VALTREX) 1000 MG tablet Take 1 daily bid for 5 days then 1 daily Patient not taking: Reported on 09/20/2017 01/25/15   [name], NP    Family History Family History  Problem Relation Age of Onset  . Depression Mother   . Anxiety disorder Mother   . Depression Father   . Alcohol abuse Father   . Other Sister        was in MVA  . Anxiety disorder Sister   . Other Brother  heart condition  . Depression Brother   . Anxiety disorder Brother   . Depression Sister   . Anxiety disorder Sister   . Depression Sister   . Anxiety disorder Sister   . Depression Brother   . Anxiety disorder Brother   . Obesity Brother   . Depression Brother   . Anxiety disorder Brother   . Depression Brother   . Anxiety disorder Brother   . Depression Brother   . Anxiety disorder Brother   . Depression Brother   . Anxiety disorder Brother   . Depression Brother   . Anxiety disorder Brother   . Depression Brother   . Anxiety disorder Brother   . Other Paternal Grandmother        heart stopped  . Dementia Maternal Grandmother   . Heart disease Maternal Grandmother   . Hypertension Maternal Grandmother   . Other Brother        was in MVA    Social History Social History   Tobacco Use  . Smoking status: Current Every Day Smoker    Packs/day: 1.00    Years:  7.00    Pack years: 7.00    Types: Cigarettes  . Smokeless tobacco: Never Used  Substance Use Topics  . Alcohol use: Yes  . Drug use: Yes    Types: Marijuana    Comment: two months ago, pills     Allergies   Hydrocodone; Raspberry; Tramadol; and Other   Review of Systems Review of Systems  All other systems reviewed and are negative.    Physical Exam Updated Vital Signs BP (!) 124/99 (BP Location: Right Arm)   Pulse 93   Temp 98.3 F (36.8 C) (Oral)   Resp 19   SpO2 99%   Physical Exam  Constitutional: She is oriented to person, place, and time. She appears well-developed and well-nourished. No distress.  She appears somewhat restless and agitated  HENT:  Head: Normocephalic and atraumatic.  Neck: Normal range of motion. Neck supple.  Cardiovascular: Normal rate and regular rhythm. Exam reveals no gallop and no friction rub.  No murmur heard. Pulmonary/Chest: Effort normal and breath sounds normal. No respiratory distress. She has no wheezes.  Abdominal: Soft. Bowel sounds are normal. She exhibits no distension. There is no tenderness.  Musculoskeletal: Normal range of motion.  Neurological: She is alert and oriented to person, place, and time.  Skin: Skin is warm and dry. She is not diaphoretic.  Nursing note and vitals reviewed.    ED Treatments / Results  Labs (all labs ordered are listed, but only abnormal results are displayed) Labs Reviewed  COMPREHENSIVE METABOLIC PANEL  CBC WITH DIFFERENTIAL/PLATELET  ETHANOL  HCG, SERUM, QUALITATIVE    EKG None  Radiology No results found.  Procedures Procedures (including critical care time)  Medications Ordered in ED Medications  cloNIDine (CATAPRES) tablet 0.1 mg (has no administration in time range)     Initial Impression / Assessment and Plan / ED Course  I have reviewed the triage vital signs and the nursing notes.  Pertinent labs & imaging results that were available during my care of the  patient were reviewed by me and considered in my medical decision making (see chart for details).  Patient presenting with signs and symptoms of opiate withdrawal.  She was given clonidine and IV fluids.  She is here in the custody of authorities after having some sort of episode at the jail.  She appears to be improving.  Laboratory studies are  unremarkable.  She will be discharged to the custody of law enforcement.  Final Clinical Impressions(s) / ED Diagnoses   Final diagnoses:  None    ED Discharge Orders    None       Geoffery Lyons, MD 09/21/17 [phone]

## 2017-09-21 NOTE — ED Notes (Addendum)
Pt appeared to be sleeping - woke pt soTTS may be performed.

## 2017-09-21 NOTE — ED Notes (Signed)
Pt returned from CT °

## 2017-12-09 ENCOUNTER — Emergency Department (HOSPITAL_COMMUNITY): Payer: No Typology Code available for payment source

## 2017-12-09 ENCOUNTER — Encounter (HOSPITAL_COMMUNITY): Payer: Self-pay

## 2017-12-09 ENCOUNTER — Emergency Department (HOSPITAL_COMMUNITY)
Admission: EM | Admit: 2017-12-09 | Discharge: 2017-12-09 | Disposition: A | Discharge status: Home / Self Care | Payer: No Typology Code available for payment source | Attending: Emergency Medicine | Admitting: Emergency Medicine

## 2017-12-09 ENCOUNTER — Other Ambulatory Visit: Payer: Self-pay

## 2017-12-09 DIAGNOSIS — M7918 Myalgia, other site: Secondary | ICD-10-CM | POA: Diagnosis not present

## 2017-12-09 DIAGNOSIS — Z79899 Other long term (current) drug therapy: Secondary | ICD-10-CM | POA: Diagnosis not present

## 2017-12-09 DIAGNOSIS — J45909 Unspecified asthma, uncomplicated: Secondary | ICD-10-CM | POA: Insufficient documentation

## 2017-12-09 DIAGNOSIS — F17210 Nicotine dependence, cigarettes, uncomplicated: Secondary | ICD-10-CM | POA: Diagnosis not present

## 2017-12-09 LAB — URINALYSIS, ROUTINE W REFLEX MICROSCOPIC
BILIRUBIN URINE: NEGATIVE
Glucose, UA: NEGATIVE mg/dL
HGB URINE DIPSTICK: NEGATIVE
Ketones, ur: NEGATIVE mg/dL
Leukocytes, UA: NEGATIVE
Nitrite: NEGATIVE
PH: 7 (ref 5.0–8.0)
Protein, ur: NEGATIVE mg/dL
SPECIFIC GRAVITY, URINE: 1.004 — AB (ref 1.005–1.030)

## 2017-12-09 LAB — POC URINE PREG, ED: PREG TEST UR: NEGATIVE

## 2017-12-09 MED ORDER — IBUPROFEN 400 MG PO TABS
400.0000 mg | ORAL_TABLET | Freq: Once | ORAL | Status: AC
  Administered 2017-12-09: 400 mg via ORAL
  Filled 2017-12-09: qty 1

## 2017-12-09 MED ORDER — CYCLOBENZAPRINE HCL 10 MG PO TABS: 10.0000 mg | ORAL_TABLET | Freq: Three times a day (TID) | ORAL | 0 refills | Status: AC

## 2017-12-09 MED ORDER — CYCLOBENZAPRINE HCL 10 MG PO TABS
10.0000 mg | ORAL_TABLET | Freq: Once | ORAL | Status: AC
  Administered 2017-12-09: 10 mg via ORAL
  Filled 2017-12-09: qty 1

## 2017-12-09 MED ORDER — IBUPROFEN 400 MG PO TABS: 400.0000 mg | ORAL_TABLET | Freq: Four times a day (QID) | ORAL | 0 refills | Status: AC | PRN

## 2017-12-09 MED ORDER — ACETAMINOPHEN 500 MG PO TABS
1000.0000 mg | ORAL_TABLET | Freq: Once | ORAL | Status: AC
  Administered 2017-12-09: 1000 mg via ORAL
  Filled 2017-12-09: qty 2
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
## 2017-12-09 NOTE — Discharge Instructions (Addendum)
Your urine pregnancy test is negative.  Your urine analysis is negative for infection or kidney stone.  The x-ray of your hip is negative, the x-ray of your pelvis is also negative.  Please use heating pad or warm tub soaks to the affected area.  Please use Flexeril 3 times daily.  Please use ibuprofen and 2 extra strength Tylenol at breakfast, lunch, dinner, and at bedtime.  Flexeril may cause drowsiness.  Please do not drive a vehicle, operate machinery, drink alcohol, or participate in activities requiring concentration when taking this medication.  Please see Dr. Janna Archondiego if any additional work-up or pain management needed.
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
## 2019-12-17 ENCOUNTER — Other Ambulatory Visit: Payer: Self-pay

## 2019-12-17 ENCOUNTER — Emergency Department (HOSPITAL_COMMUNITY)
Admission: EM | Admit: 2019-12-17 | Discharge: 2019-12-18 | Disposition: A | Discharge status: Home / Self Care | Payer: Self-pay | Attending: Emergency Medicine | Admitting: Emergency Medicine

## 2019-12-17 ENCOUNTER — Encounter (HOSPITAL_COMMUNITY): Payer: Self-pay | Admitting: Emergency Medicine

## 2019-12-17 DIAGNOSIS — Z7951 Long term (current) use of inhaled steroids: Secondary | ICD-10-CM | POA: Insufficient documentation

## 2019-12-17 DIAGNOSIS — F17210 Nicotine dependence, cigarettes, uncomplicated: Secondary | ICD-10-CM | POA: Insufficient documentation

## 2019-12-17 DIAGNOSIS — J45909 Unspecified asthma, uncomplicated: Secondary | ICD-10-CM | POA: Insufficient documentation

## 2019-12-17 DIAGNOSIS — K0889 Other specified disorders of teeth and supporting structures: Secondary | ICD-10-CM | POA: Insufficient documentation

## 2019-12-17 DIAGNOSIS — K029 Dental caries, unspecified: Secondary | ICD-10-CM

## 2019-12-17 LAB — COMPREHENSIVE METABOLIC PANEL
ALT: 18 U/L (ref 0–44)
AST: 26 U/L (ref 15–41)
Albumin: 4.8 g/dL (ref 3.5–5.0)
Alkaline Phosphatase: 119 U/L (ref 38–126)
Anion gap: 15 (ref 5–15)
BUN: 12 mg/dL (ref 6–20)
CO2: 23 mmol/L (ref 22–32)
Calcium: 10.4 mg/dL — ABNORMAL HIGH (ref 8.9–10.3)
Chloride: 105 mmol/L (ref 98–111)
Creatinine, Ser: 1.55 mg/dL — ABNORMAL HIGH (ref 0.44–1.00)
GFR calc Af Amer: 53 mL/min — ABNORMAL LOW (ref >60)
GFR calc non Af Amer: 45 mL/min — ABNORMAL LOW (ref >60)
Glucose, Bld: 117 mg/dL — ABNORMAL HIGH (ref 70–99)
Potassium: 4.6 mmol/L (ref 3.5–5.1)
Sodium: 143 mmol/L (ref 135–145)
Total Bilirubin: 0.2 mg/dL — ABNORMAL LOW (ref 0.3–1.2)
Total Protein: 9 g/dL — ABNORMAL HIGH (ref 6.5–8.1)

## 2019-12-17 LAB — CBC WITH DIFFERENTIAL/PLATELET
Abs Immature Granulocytes: 0.03 10*3/uL (ref 0.00–0.07)
Basophils Absolute: 0 10*3/uL (ref 0.0–0.1)
Basophils Relative: 0 %
Eosinophils Absolute: 0.1 10*3/uL (ref 0.0–0.5)
Eosinophils Relative: 1 %
HCT: 40.7 % (ref 36.0–46.0)
Hemoglobin: 12.7 g/dL (ref 12.0–15.0)
Immature Granulocytes: 0 %
Lymphocytes Relative: 28 %
Lymphs Abs: 3 10*3/uL (ref 0.7–4.0)
MCH: 28.1 pg (ref 26.0–34.0)
MCHC: 31.2 g/dL (ref 30.0–36.0)
MCV: 90 fL (ref 80.0–100.0)
Monocytes Absolute: 0.6 10*3/uL (ref 0.1–1.0)
Monocytes Relative: 6 %
Neutro Abs: 7.3 10*3/uL (ref 1.7–7.7)
Neutrophils Relative %: 65 %
Platelets: 343 10*3/uL (ref 150–400)
RBC: 4.52 MIL/uL (ref 3.87–5.11)
RDW: 14.7 % (ref 11.5–15.5)
WBC: 11.1 10*3/uL — ABNORMAL HIGH (ref 4.0–10.5)
nRBC: 0 % (ref 0.0–0.2)

## 2019-12-17 LAB — LACTIC ACID, PLASMA: Lactic Acid, Venous: 1.8 mmol/L (ref 0.5–1.9)

## 2019-12-17 LAB — I-STAT BETA HCG BLOOD, ED (MC, WL, AP ONLY): I-stat hCG, quantitative: <5 m[IU]/mL (ref <5)

## 2019-12-17 NOTE — ED Triage Notes (Signed)
Patient reports dental pain in front upper tooth since yesterday. Reports worsening swelling in face today. Swelling noted to bilateral cheeks. Denies SOB, difficulty breathing. Reports pain radiating into head. Tachycardic in triage.

## 2019-12-17 NOTE — ED Notes (Signed)
Pt called for triage x1! No answer °

## 2019-12-17 NOTE — ED Notes (Signed)
1 set of blood cultures has been collected if ordered

## 2019-12-18 ENCOUNTER — Encounter (HOSPITAL_COMMUNITY): Payer: Self-pay | Admitting: Emergency Medicine

## 2019-12-18 MED ORDER — ACETAMINOPHEN 500 MG PO TABS
1000.0000 mg | ORAL_TABLET | Freq: Once | ORAL | Status: AC
  Administered 2019-12-18: 1000 mg via ORAL
  Filled 2019-12-18: qty 2

## 2019-12-18 MED ORDER — PENICILLIN V POTASSIUM 500 MG PO TABS
500.0000 mg | ORAL_TABLET | Freq: Four times a day (QID) | ORAL | 0 refills | Status: AC
Start: 2019-12-18 — End: 2019-12-25

## 2019-12-18 MED ORDER — PENICILLIN V POTASSIUM 500 MG PO TABS
500.0000 mg | ORAL_TABLET | Freq: Once | ORAL | Status: AC
  Administered 2019-12-18: 500 mg via ORAL
  Filled 2019-12-18: qty 1

## 2019-12-18 MED ORDER — SODIUM CHLORIDE 0.9 % IV BOLUS
500.0000 mL | Freq: Once | INTRAVENOUS | Status: AC
  Administered 2019-12-18: 500 mL via INTRAVENOUS

## 2019-12-18 MED ORDER — KETOROLAC TROMETHAMINE 30 MG/ML IJ SOLN
15.0000 mg | Freq: Once | INTRAMUSCULAR | Status: AC
  Administered 2019-12-18: 15 mg via INTRAVENOUS
  Filled 2019-12-18: qty 1

## 2019-12-18 MED ORDER — CHLORHEXIDINE GLUCONATE 0.12 % MT SOLN: 15.0000 mL | Freq: Two times a day (BID) | OROMUCOSAL | 0 refills | Status: AC

## 2019-12-18 MED ORDER — NAPROXEN 375 MG PO TABS: 375.0000 mg | ORAL_TABLET | Freq: Two times a day (BID) | ORAL | 0 refills | Status: AC

## 2019-12-18 NOTE — ED Notes (Signed)
Patient has been placed on the cardiac monitor.

## 2019-12-18 NOTE — ED Provider Notes (Signed)
[facility]-EMERGENCY DEPT Provider Note   CSN: [id number] Arrival date & time: 12/17/19  2208     History Chief Complaint  Patient presents with  . Dental Pain    Mary [name] is a 29 y.o. female.  The history is provided by the patient.  Dental Pain Location:  Upper Upper teeth location:  4/RU 2nd bicuspid, 5/RU 1st bicuspid and 3/RU 1st molar Quality:  Aching Severity:  Severe Onset quality:  Gradual Timing:  Constant Progression:  Worsening Chronicity:  Recurrent Context: dental caries   Previous work-up:  Dental exam Relieved by:  Nothing Worsened by:  Nothing Ineffective treatments:  None tried Associated symptoms: facial pain   Associated symptoms: no congestion and no fever   Risk factors comment:  Polysubstance abuse  Patient with a h/o polysubstance abuse and overdose presents with multiple teeth that are necrotic.  She last had teeth removed 2 years ago (not 2 days).  No f/c/r. Face feels swollen on the right.      Past Medical History:  Diagnosis Date  . Abdominal cramps 01/25/2015  . Asthma   . Back pain   . Chlamydia   . Depression   . Dysmenorrhea 06/04/2013  . Herpes   . IBS (irritable bowel syndrome)   . Lymph nodes enlarged 06/04/2013   Bilateral in groin L>R +trich  . Migraine   . Missed periods 01/25/2015  . Trichomonal vaginitis 06/04/2013    Patient Active Problem List   Diagnosis Date Noted  . Herpes 01/25/2015  . Missed periods 01/25/2015  . Abdominal cramps 01/25/2015  . 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 Chlamydia infection 06/05/2013  . Vaginal discharge 06/04/2013  . Trichomonal vaginitis 06/04/2013  . Lymph nodes enlarged 06/04/2013  . Dysmenorrhea 06/04/2013    Past Surgical History:  Procedure Laterality Date  . TONSILLECTOMY       OB History    Gravida  3   Para      Term      Preterm      AB  3   Living        SAB  3   TAB      Ectopic      Multiple        Live Births              Family History  Problem Relation Age of Onset  . Depression Mother   . Anxiety disorder Mother   . Depression Father   . Alcohol abuse Father   . Other Sister        was in MVA  . Anxiety disorder Sister   . Other Brother        heart condition  . Depression Brother   . Anxiety disorder Brother   . Depression Sister   . Anxiety disorder Sister   . Depression Sister   . Anxiety disorder Sister   . Depression Brother   . Anxiety disorder Brother   . Obesity Brother   . Depression Brother   . Anxiety disorder Brother   . Depression Brother   . Anxiety disorder Brother   . Depression Brother   . Anxiety disorder Brother   . Depression Brother   . Anxiety disorder Brother   . Depression Brother   . Anxiety disorder Brother   . Depression Brother   . Anxiety disorder Brother   . Other Paternal Grandmother        heart stopped  .  Dementia Maternal Grandmother   . Heart disease Maternal Grandmother   . Hypertension Maternal Grandmother   . Other Brother        was in MVA    Social History   Tobacco Use  . Smoking status: Current Every Day Smoker    Packs/day: 1.00    Years: 7.00    Pack years: 7.00    Types: Cigarettes  . Smokeless tobacco: Never Used  Substance Use Topics  . Alcohol use: Not Currently  . Drug use: Not Currently    Types: Marijuana    Comment: pills    Home Medications Prior to Admission medications   Medication Sig Start Date End Date Taking? Authorizing Provider  albuterol (PROVENTIL HFA;VENTOLIN HFA) 108 (90 BASE) MCG/ACT inhaler Inhale 2 puffs into the lungs every 4 (four) hours as needed for wheezing or shortness of breath. 07/12/13   Gilda Crease, MD  chlorhexidine (PERIDEX) 0.12 % solution Use as directed 15 mLs in the mouth or throat 2 (two) times daily. 12/18/19   Jerzi Tigert, MD  cyclobenzaprine (FLEXERIL) 10 MG tablet Take 1 tablet (10 mg total) by mouth 3 (three) times daily. 12/09/17    Ivery Quale, PA-C  ibuprofen (ADVIL,MOTRIN) 400 MG tablet Take 1 tablet (400 mg total) by mouth every 6 (six) hours as needed. 12/09/17   Ivery Quale, PA-C  naproxen (NAPROSYN) 375 MG tablet Take 1 tablet (375 mg total) by mouth 2 (two) times daily with a meal. 12/18/19   Aviannah Castoro, MD  penicillin v potassium (VEETID) 500 MG tablet Take 1 tablet (500 mg total) by mouth 4 (four) times daily for 7 days. 12/18/19 12/25/19  Decorey Wahlert, MD  valACYclovir (VALTREX) 1000 MG tablet Take 1 daily bid for 5 days then 1 daily Patient not taking: Reported on 09/20/2017 01/25/15   [name], NP    Allergies    Hydrocodone, Raspberry, Tramadol, and Other  Review of Systems   Review of Systems  Constitutional: Negative for fever.  HENT: Positive for dental problem. Negative for congestion, sore throat, trouble swallowing and voice change.   Eyes: Negative for visual disturbance.  Respiratory: Negative for shortness of breath.   Cardiovascular: Negative for chest pain.  Gastrointestinal: Negative for abdominal pain.  Genitourinary: Negative for difficulty urinating.  Musculoskeletal: Negative for arthralgias.  Skin: Negative for rash.  Neurological: Negative for dizziness.  Psychiatric/Behavioral: Negative for agitation.  All other systems reviewed and are negative.   Physical Exam Updated Vital Signs BP (!) 141/94 (BP Location: Left Arm)   Pulse (!) 118   Temp 98.1 F (36.7 C) (Oral)   Resp 15   Ht 5' (1.524 m)   Wt 56.7 kg   SpO2 100%   BMI 24.41 kg/m   Physical Exam Vitals and nursing note reviewed.  Constitutional:      General: She is not in acute distress.    Appearance: Normal appearance. She is not ill-appearing.  HENT:     Head: Normocephalic and atraumatic.     Comments: No facial swelling. No trismus no warmth no erythema     Nose: Nose normal.     Mouth/Throat:     Mouth: Mucous membranes are moist.     Pharynx: Oropharynx is clear.     Comments: No  trismus multiple necrotic teeth (meth mouth) Eyes:     Extraocular Movements: Extraocular movements intact.     Conjunctiva/sclera: Conjunctivae normal.  Cardiovascular:     Rate and Rhythm: Regular rhythm. Tachycardia  present.     Pulses: Normal pulses.     Heart sounds: Normal heart sounds.  Pulmonary:     Effort: Pulmonary effort is normal.     Breath sounds: Normal breath sounds. No stridor. No wheezing.  Abdominal:     General: Abdomen is flat. Bowel sounds are normal.     Palpations: Abdomen is soft.     Tenderness: There is no abdominal tenderness. There is no guarding or rebound.  Musculoskeletal:        General: Normal range of motion.     Cervical back: Normal range of motion and neck supple.  Lymphadenopathy:     Cervical: No cervical adenopathy.  Skin:    General: Skin is warm and dry.     Capillary Refill: Capillary refill takes less than 2 seconds.  Neurological:     General: No focal deficit present.     Mental Status: She is alert and oriented to person, place, and time.     Deep Tendon Reflexes: Reflexes normal.  Psychiatric:        Thought Content: Thought content normal.     ED Results / Procedures / Treatments   Labs (all labs ordered are listed, but only abnormal results are displayed) Labs Reviewed  COMPREHENSIVE METABOLIC PANEL - Abnormal; Notable for the following components:      Result Value   Glucose, Bld 117 (*)    Creatinine, Ser 1.55 (*)    Calcium 10.4 (*)    Total Protein 9.0 (*)    Total Bilirubin 0.2 (*)    GFR calc non Af Amer 45 (*)    GFR calc Af Amer 53 (*)    All other components within normal limits  CBC WITH DIFFERENTIAL/PLATELET - Abnormal; Notable for the following components:   WBC 11.1 (*)    All other components within normal limits  LACTIC ACID, PLASMA  LACTIC ACID, PLASMA  I-STAT BETA HCG BLOOD, ED ([facility], [facility], [facility] ONLY)    EKG None  Radiology No results found.  Procedures Procedures (including critical care  time)  Medications Ordered in ED Medications  ketorolac (TORADOL) 30 MG/ML injection 15 mg (15 mg Intravenous Given 12/18/19 0051)  acetaminophen (TYLENOL) tablet 1,000 mg (1,000 mg Oral Given 12/18/19 0051)  sodium chloride 0.9 % bolus 500 mL (0 mLs Intravenous Stopped 12/18/19 0104)  penicillin v potassium (VEETID) tablet 500 mg (500 mg Oral Given 12/18/19 0051)    ED Course  I have reviewed the triage vital signs and the nursing notes.  Pertinent labs & imaging results that were available during my care of the patient were reviewed by me and considered in my medical decision making (see chart for details).    HR improved markedly in the ED. The patient is not septic. There are no signs of deep space infection. The patient appears to be actively "tweeking" in the ED.  I have seen and appreciate the nurses note but the patient states her last extraction was 2 years ago not 2 days ago.  She will need to follow up with dentistry for ongoing imaging and definitive care of multiple necrotic teeth.  Have started antibiotics.    Pati GalloMargret N Vlcek was evaluated in Emergency Department on 12/18/2019 for the symptoms described in the history of present illness. She was evaluated in the context of the global COVID-19 pandemic, which necessitated consideration that the patient might be at risk for infection with the SARS-CoV-2 virus that causes COVID-19. Institutional protocols and algorithms that  pertain to the evaluation of patients at risk for COVID-19 are in a state of rapid change based on information released by regulatory bodies including the CDC and federal and state organizations. These policies and algorithms were followed during the patient's care in the ED.  Final Clinical Impression(s) / ED Diagnoses Final diagnoses:  None   Return for intractable cough, coughing up blood,fevers >100.4 unrelieved by medication, shortness of breath, intractable vomiting, chest pain, shortness of breath,  weakness,numbness, changes in speech, facial asymmetry,abdominal pain, passing out,Inability to tolerate liquids or food, cough, altered mental status or any concerns. No signs of systemic illness or infection. The patient is nontoxic-appearing on exam and vital signs are within normal limits.   I have reviewed the triage vital signs and the nursing notes. Pertinent labs &imaging results that were available during my care of the patient were reviewed by me and considered in my medical decision making (see chart for details).After history, exam, and medical workup I feel the patient has beenappropriately medically screened and is safe for discharge home. Pertinent diagnoses were discussed with the patient. Patient was given return precautions. Rx / DC Orders ED Discharge Orders         Ordered    penicillin v potassium (VEETID) 500 MG tablet  4 times daily     Discontinue  Reprint     12/18/19 0045    naproxen (NAPROSYN) 375 MG tablet  2 times daily with meals     Discontinue  Reprint     12/18/19 0046    chlorhexidine (PERIDEX) 0.12 % solution  2 times daily     Discontinue  Reprint     12/18/19 0046           Shekia Kuper, MD 12/18/19 0130

## 2019-12-18 NOTE — ED Notes (Signed)
Patient states that she had 2 teeth extractions done yesterday, woke up today with the left side of her face swelling and c/o generalized facial pain at this time.

## 2023-10-02 ENCOUNTER — Encounter (HOSPITAL_COMMUNITY): Payer: Self-pay | Admitting: Emergency Medicine

## 2023-10-02 ENCOUNTER — Emergency Department (HOSPITAL_COMMUNITY): Payer: MEDICAID

## 2023-10-02 ENCOUNTER — Other Ambulatory Visit: Payer: Self-pay

## 2023-10-02 ENCOUNTER — Emergency Department (HOSPITAL_COMMUNITY)
Admission: EM | Admit: 2023-10-02 | Discharge: 2023-10-02 | Disposition: A | Discharge status: Home / Self Care | Payer: MEDICAID | Attending: Emergency Medicine | Admitting: Emergency Medicine

## 2023-10-02 DIAGNOSIS — J45909 Unspecified asthma, uncomplicated: Secondary | ICD-10-CM | POA: Insufficient documentation

## 2023-10-02 DIAGNOSIS — Z79899 Other long term (current) drug therapy: Secondary | ICD-10-CM | POA: Insufficient documentation

## 2023-10-02 DIAGNOSIS — R059 Cough, unspecified: Secondary | ICD-10-CM | POA: Insufficient documentation

## 2023-10-02 DIAGNOSIS — T189XXA Foreign body of alimentary tract, part unspecified, initial encounter: Secondary | ICD-10-CM | POA: Insufficient documentation

## 2023-10-02 DIAGNOSIS — R040 Epistaxis: Secondary | ICD-10-CM | POA: Diagnosis not present

## 2023-10-02 DIAGNOSIS — R042 Hemoptysis: Secondary | ICD-10-CM | POA: Insufficient documentation

## 2023-10-02 DIAGNOSIS — F17210 Nicotine dependence, cigarettes, uncomplicated: Secondary | ICD-10-CM | POA: Diagnosis not present

## 2023-10-02 DIAGNOSIS — Z7951 Long term (current) use of inhaled steroids: Secondary | ICD-10-CM | POA: Diagnosis not present

## 2023-10-02 DIAGNOSIS — J3489 Other specified disorders of nose and nasal sinuses: Secondary | ICD-10-CM | POA: Insufficient documentation

## 2023-10-02 LAB — BASIC METABOLIC PANEL WITH GFR
Anion gap: 10 (ref 5–15)
BUN: 13 mg/dL (ref 6–20)
CO2: 28 mmol/L (ref 22–32)
Calcium: 9.4 mg/dL (ref 8.9–10.3)
Chloride: 103 mmol/L (ref 98–111)
Creatinine, Ser: 0.84 mg/dL (ref 0.44–1.00)
GFR, Estimated: >60 mL/min (ref >60)
Glucose, Bld: 105 mg/dL — ABNORMAL HIGH (ref 70–99)
Potassium: 3.9 mmol/L (ref 3.5–5.1)
Sodium: 141 mmol/L (ref 135–145)

## 2023-10-02 LAB — CBC WITH DIFFERENTIAL/PLATELET
Abs Immature Granulocytes: 0.03 10*3/uL (ref 0.00–0.07)
Basophils Absolute: 0 10*3/uL (ref 0.0–0.1)
Basophils Relative: 0 %
Eosinophils Absolute: 0.1 10*3/uL (ref 0.0–0.5)
Eosinophils Relative: 1 %
HCT: 34.8 % — ABNORMAL LOW (ref 36.0–46.0)
Hemoglobin: 10.7 g/dL — ABNORMAL LOW (ref 12.0–15.0)
Immature Granulocytes: 0 %
Lymphocytes Relative: 26 %
Lymphs Abs: 2.7 10*3/uL (ref 0.7–4.0)
MCH: 26.4 pg (ref 26.0–34.0)
MCHC: 30.7 g/dL (ref 30.0–36.0)
MCV: 85.9 fL (ref 80.0–100.0)
Monocytes Absolute: 0.5 10*3/uL (ref 0.1–1.0)
Monocytes Relative: 5 %
Neutro Abs: 6.9 10*3/uL (ref 1.7–7.7)
Neutrophils Relative %: 68 %
Platelets: 319 10*3/uL (ref 150–400)
RBC: 4.05 MIL/uL (ref 3.87–5.11)
RDW: 15.3 % (ref 11.5–15.5)
WBC: 10.3 10*3/uL (ref 4.0–10.5)
nRBC: 0 % (ref 0.0–0.2)

## 2023-10-02 MED ORDER — IOHEXOL 350 MG/ML SOLN
75.0000 mL | Freq: Once | INTRAVENOUS | Status: AC | PRN
  Administered 2023-10-02: 75 mL via INTRAVENOUS

## 2023-10-02 MED ORDER — ALBUTEROL SULFATE (2.5 MG/3ML) 0.083% IN NEBU
2.5000 mg | INHALATION_SOLUTION | Freq: Once | RESPIRATORY_TRACT | Status: AC
  Administered 2023-10-02: 2.5 mg via RESPIRATORY_TRACT
  Filled 2023-10-02: qty 3

## 2023-10-02 NOTE — ED Notes (Signed)
 Pt resting with eyes closed; respirations spontaneous, even, unlabored
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
## 2023-10-02 NOTE — Discharge Instructions (Signed)
 CT scan did not show any concerning findings.  You are also evaluated by pulmonology team who did not feel there was any concern for internal bleeding.  Your bleeding is likely coming from the trauma to your nose.  This is likely draining in your throat which you are coughing back up.  Blood work was stable.  Follow-up with your primary care doctor.  Return for any emergent symptoms.
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
## 2023-10-02 NOTE — ED Notes (Signed)
 Patient verbalizes understanding of discharge instructions. Opportunity for questioning and answers were provided. Pt discharged from ED.

## 2023-10-02 NOTE — ED Notes (Signed)
 Patient transported to CT
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
## 2023-10-22 ENCOUNTER — Emergency Department: Payer: MEDICAID

## 2023-10-22 ENCOUNTER — Emergency Department
Admission: EM | Admit: 2023-10-22 | Discharge: 2023-10-22 | Disposition: A | Discharge status: Home / Self Care | Payer: MEDICAID | Attending: Emergency Medicine | Admitting: Emergency Medicine

## 2023-10-22 ENCOUNTER — Emergency Department
Admission: EM | Admit: 2023-10-22 | Discharge: 2023-10-22 | Disposition: A | Discharge status: Home / Self Care | Payer: MEDICAID | Source: Home / Self Care | Attending: Emergency Medicine | Admitting: Emergency Medicine

## 2023-10-22 DIAGNOSIS — N760 Acute vaginitis: Secondary | ICD-10-CM

## 2023-10-22 DIAGNOSIS — F1193 Opioid use, unspecified with withdrawal: Secondary | ICD-10-CM

## 2023-10-22 DIAGNOSIS — E876 Hypokalemia: Secondary | ICD-10-CM

## 2023-10-22 DIAGNOSIS — R0789 Other chest pain: Secondary | ICD-10-CM

## 2023-10-22 DIAGNOSIS — R4182 Altered mental status, unspecified: Secondary | ICD-10-CM | POA: Insufficient documentation

## 2023-10-22 DIAGNOSIS — Y900 Blood alcohol level of less than 20 mg/100 ml: Secondary | ICD-10-CM | POA: Insufficient documentation

## 2023-10-22 DIAGNOSIS — F1113 Opioid abuse with withdrawal: Secondary | ICD-10-CM | POA: Diagnosis present

## 2023-10-22 DIAGNOSIS — B9689 Other specified bacterial agents as the cause of diseases classified elsewhere: Secondary | ICD-10-CM | POA: Diagnosis not present

## 2023-10-22 LAB — COMPREHENSIVE METABOLIC PANEL WITH GFR
ALT: 26 U/L (ref 0–44)
AST: 26 U/L (ref 15–41)
Albumin: 4.1 g/dL (ref 3.5–5.0)
Alkaline Phosphatase: 75 U/L (ref 38–126)
Anion gap: 13 (ref 5–15)
BUN: 17 mg/dL (ref 6–20)
CO2: 19 mmol/L — ABNORMAL LOW (ref 22–32)
Calcium: 9.5 mg/dL (ref 8.9–10.3)
Chloride: 110 mmol/L (ref 98–111)
Creatinine, Ser: 0.89 mg/dL (ref 0.44–1.00)
GFR, Estimated: >60 mL/min (ref >60)
Glucose, Bld: 95 mg/dL (ref 70–99)
Potassium: 2.7 mmol/L — CL (ref 3.5–5.1)
Sodium: 142 mmol/L (ref 135–145)
Total Bilirubin: 0.7 mg/dL (ref 0.0–1.2)
Total Protein: 8.7 g/dL — ABNORMAL HIGH (ref 6.5–8.1)

## 2023-10-22 LAB — WET PREP, GENITAL
Sperm: NONE SEEN
Trich, Wet Prep: NONE SEEN
WBC, Wet Prep HPF POC: <10 (ref <10)
Yeast Wet Prep HPF POC: NONE SEEN

## 2023-10-22 LAB — CHLAMYDIA/NGC RT PCR (ARMC ONLY)
Chlamydia Tr: NOT DETECTED
N gonorrhoeae: NOT DETECTED

## 2023-10-22 LAB — CBC WITH DIFFERENTIAL/PLATELET
Abs Immature Granulocytes: 0.04 10*3/uL (ref 0.00–0.07)
Basophils Absolute: 0 10*3/uL (ref 0.0–0.1)
Basophils Relative: 0 %
Eosinophils Absolute: 0 10*3/uL (ref 0.0–0.5)
Eosinophils Relative: 0 %
HCT: 40.3 % (ref 36.0–46.0)
Hemoglobin: 12.8 g/dL (ref 12.0–15.0)
Immature Granulocytes: 0 %
Lymphocytes Relative: 30 %
Lymphs Abs: 3.2 10*3/uL (ref 0.7–4.0)
MCH: 26 pg (ref 26.0–34.0)
MCHC: 31.8 g/dL (ref 30.0–36.0)
MCV: 81.9 fL (ref 80.0–100.0)
Monocytes Absolute: 0.7 10*3/uL (ref 0.1–1.0)
Monocytes Relative: 7 %
Neutro Abs: 6.6 10*3/uL (ref 1.7–7.7)
Neutrophils Relative %: 63 %
Platelets: 577 10*3/uL — ABNORMAL HIGH (ref 150–400)
RBC: 4.92 MIL/uL (ref 3.87–5.11)
RDW: 16.3 % — ABNORMAL HIGH (ref 11.5–15.5)
WBC: 10.6 10*3/uL — ABNORMAL HIGH (ref 4.0–10.5)
nRBC: 0 % (ref 0.0–0.2)

## 2023-10-22 LAB — HIV ANTIBODY (ROUTINE TESTING W REFLEX): HIV Screen 4th Generation wRfx: NONREACTIVE

## 2023-10-22 LAB — ETHANOL: Alcohol, Ethyl (B): <15 mg/dL (ref <15)

## 2023-10-22 LAB — MAGNESIUM: Magnesium: 2.1 mg/dL (ref 1.7–2.4)

## 2023-10-22 LAB — ACETAMINOPHEN LEVEL: Acetaminophen (Tylenol), Serum: <10 ug/mL — ABNORMAL LOW (ref 10–30)

## 2023-10-22 LAB — HCG, QUANTITATIVE, PREGNANCY: hCG, Beta Chain, Quant, S: 1 m[IU]/mL (ref <5)

## 2023-10-22 MED ORDER — POTASSIUM CHLORIDE CRYS ER 20 MEQ PO TBCR
20.0000 meq | EXTENDED_RELEASE_TABLET | Freq: Two times a day (BID) | ORAL | 0 refills | Status: AC
Start: 2023-10-22 — End: 2023-10-25

## 2023-10-22 MED ORDER — CLONIDINE HCL 0.1 MG PO TABS
0.1000 mg | ORAL_TABLET | Freq: Once | ORAL | Status: AC
  Administered 2023-10-22: 0.1 mg via ORAL
  Filled 2023-10-22: qty 1

## 2023-10-22 MED ORDER — HYDROXYZINE PAMOATE 50 MG PO CAPS: 50.0000 mg | ORAL_CAPSULE | Freq: Three times a day (TID) | ORAL | 0 refills | Status: AC | PRN

## 2023-10-22 MED ORDER — ONDANSETRON HCL 4 MG PO TABS: 4.0000 mg | ORAL_TABLET | Freq: Four times a day (QID) | ORAL | 0 refills | Status: AC | PRN

## 2023-10-22 MED ORDER — POTASSIUM CHLORIDE CRYS ER 20 MEQ PO TBCR
40.0000 meq | EXTENDED_RELEASE_TABLET | Freq: Once | ORAL | Status: AC
  Administered 2023-10-22: 40 meq via ORAL
  Filled 2023-10-22: qty 2

## 2023-10-22 MED ORDER — METRONIDAZOLE 500 MG PO TABS: 500.0000 mg | ORAL_TABLET | Freq: Two times a day (BID) | ORAL | 0 refills | Status: AC

## 2023-10-22 MED ORDER — HYDROXYZINE HCL 25 MG PO TABS
50.0000 mg | ORAL_TABLET | Freq: Once | ORAL | Status: AC
  Administered 2023-10-22: 50 mg via ORAL
  Filled 2023-10-22: qty 2

## 2023-10-22 MED ORDER — ONDANSETRON 4 MG PO TBDP
4.0000 mg | ORAL_TABLET | Freq: Once | ORAL | Status: AC
  Administered 2023-10-22: 4 mg via ORAL
  Filled 2023-10-22: qty 1

## 2023-10-22 MED ORDER — KETOROLAC TROMETHAMINE 15 MG/ML IJ SOLN
15.0000 mg | Freq: Once | INTRAMUSCULAR | Status: AC
  Administered 2023-10-22: 15 mg via INTRAMUSCULAR
  Filled 2023-10-22: qty 1

## 2023-10-22 MED ORDER — DICYCLOMINE HCL 10 MG PO CAPS: 10.0000 mg | ORAL_CAPSULE | Freq: Three times a day (TID) | ORAL | 0 refills | Status: AC | PRN

## 2023-10-22 NOTE — ED Provider Notes (Signed)
 [facility] Provider Note    Event Date/Time   First MD Initiated Contact with Patient 10/22/23 1413     (approximate)   History   Shortness of Breath   HPI  Mary [name] is a 33 year old presenting to the ER for shortness of breath. Seen by myself just prior to checking back into the ER. During that visit, patient was seen for opioid withdrawal symptoms.  She tells me that she continues to have pain across her chest wall.  She is requesting mammogram.  I discussed this is not performed in the ER after which she requested a MRI with contrast of "all her organs".  I did review her triage note which notes that patient attempted to get a ride back to her hotel unsuccessfully prior to checking back into the ER.     Physical Exam   Triage Vital Signs: ED Triage Vitals [10/22/23 1418]  Encounter Vitals Group     BP 117/83     Systolic BP Percentile      Diastolic BP Percentile      Pulse Rate 91     Resp 14     Temp 97.9 F (36.6 C)     Temp Source Oral     SpO2 100 %     Weight      Height      Head Circumference      Peak Flow      Pain Score 0     Pain Loc      Pain Education      Exclude from Growth Chart     Most recent vital signs: Vitals:   10/22/23 1418  BP: 117/83  Pulse: 91  Resp: 14  Temp: 97.9 F (36.6 C)  SpO2: 100%     General: Awake, interactive  CV:  Regular rate, good peripheral perfusion.  Chest wall: Mild generalized tenderness without focal area of point tenderness Resp:  Unlabored respirations.  Abd:  Nondistended.  Neuro:  Symmetric facial movement, fluid speech   ED Results / Procedures / Treatments   Labs (all labs ordered are listed, but only abnormal results are displayed) Labs Reviewed - No data to display   EKG EKG independently reviewed interpreted by myself (ER attending) demonstrates:    RADIOLOGY Imaging independently reviewed and interpreted by myself demonstrates:   Formal Radiology  Read:  [facility] Chest Port 1 View Result Date: 10/22/2023 CLINICAL DATA:  Chest wall pain. EXAM: PORTABLE CHEST 1 VIEW COMPARISON:  CT chest dated 10/02/2023. FINDINGS: The heart size and mediastinal contours are within normal limits. No focal consolidation, sizeable pleural effusion, or pneumothorax. Cholecystectomy clips in the right upper quadrant. No acute osseous abnormality. IMPRESSION: No acute cardiopulmonary findings. Electronically Signed   By: Mannie Seek M.D.   On: 10/22/2023 12:23   CT Head Wo Contrast Result Date: 10/22/2023 CLINICAL DATA:  33 year old female with altered mental status and fall. EXAM: CT HEAD WITHOUT CONTRAST TECHNIQUE: Contiguous axial images were obtained from the base of the skull through the vertex without intravenous contrast. RADIATION DOSE REDUCTION: This exam was performed according to the departmental dose-optimization program which includes automated exposure control, adjustment of the mA and/or kV according to patient size and/or use of iterative reconstruction technique. COMPARISON:  Head CT 12/22/2007. FINDINGS: Brain: No midline shift, ventriculomegaly, mass effect, evidence of mass lesion, intracranial hemorrhage or evidence of cortically based acute infarction. Gray-white matter differentiation is within normal limits throughout the brain. Vascular: No suspicious  intracranial vascular hyperdensity. Skull: Intact.  No acute osseous abnormality identified. Sinuses/Orbits: Mild bubbly opacity in the right sphenoid, mild chronic left sphenoid mucosal thickening. Other Visualized paranasal sinuses and mastoids are stable and well aerated. Other: No acute orbit or scalp soft tissue injury identified. IMPRESSION: 1. Normal noncontrast CT appearance of the brain. No acute traumatic injury identified. 2. Mild sphenoid sinus inflammation. Electronically Signed   By: Marlise Simpers M.D.   On: 10/22/2023 09:15    PROCEDURES:  Critical Care performed:  No  Procedures   MEDICATIONS ORDERED IN ED: Medications - No data to display   IMPRESSION / MDM / ASSESSMENT AND PLAN / ED COURSE  I reviewed the triage vital signs and the nursing notes.  Differential diagnosis includes, but is not limited to, musculoskeletal strain, low suspicion displaced rib fracture, pneumothorax given recent negative x-Aurea Aronov, no chest pain, abnormal vitals suggestive of ACS, PE, low risk PE and PERC negative  Patient's presentation is most consistent with acute illness / injury with system symptoms.  33 year old presenting with chest wall pain.  Seen by myself just prior to her checking and with reassuring workup.  I do not feel any additional evaluation is indicated at this time.  I did discuss supportive care measures in the setting of likely musculoskeletal strain from her fall.  She was given a bus pass.  During my prior visit I did confirm that patient has a place to stay at the [facility] and she did report feeling safe there.  She was discharged in stable condition.      FINAL CLINICAL IMPRESSION(S) / ED DIAGNOSES   Final diagnoses:  Chest wall pain     Rx / DC Orders   ED Discharge Orders     None        Note:  This document was prepared using Dragon voice recognition software and may include unintentional dictation errors.   Claria Crofts, MD 10/22/23 [phone]

## 2023-10-22 NOTE — ED Notes (Signed)
 Pt back from ct

## 2023-10-22 NOTE — ED Provider Notes (Addendum)
 [facility] Provider Note    Event Date/Time   First MD Initiated Contact with Patient 10/22/23 [phone]     (approximate)   History   Altered Mental Status   HPI  Mary [name] is a 33 year old female with history of fentanyl use presenting to the emergency department for evaluation of altered mental status.  Per EMS, patient was called out for unresponsiveness.  On their arrival, patient was awake, but not readily answering questions.  Unknown last known well.  On my evaluation, patient tells me that she may have fallen yesterday, no obvious injuries.  She additionally reports feeling achy all over and jittery.  States she typically uses fentanyl daily, but has not used in 2 days as she ran out of money.  Does feel like she is currently withdrawing.     Physical Exam   Triage Vital Signs: ED Triage Vitals [10/22/23 0832]  Encounter Vitals Group     BP      Systolic BP Percentile      Diastolic BP Percentile      Pulse      Resp      Temp 98.7 F (37.1 C)     Temp Source Oral     SpO2      Weight      Height      Head Circumference      Peak Flow      Pain Score      Pain Loc      Pain Education      Exclude from Growth Chart     Most recent vital signs: Vitals:   10/22/23 1200 10/22/23 1343  BP: 109/73   Pulse: (!) 56   Resp: (!) 22   Temp:  98.6 F (37 C)  SpO2: 92%      General: Awake, interactive CV:  Regular rate, good peripheral perfusion.  Resp:  Unlabored respirations.  Abd:  Nondistended.  Neuro:  Pupils equal and reactive, normal facial symmetry, symmetric strength of bilateral upper and lower extremities with intact sensation, mildly dysarthric speech   ED Results / Procedures / Treatments   Labs (all labs ordered are listed, but only abnormal results are displayed) Labs Reviewed  WET PREP, GENITAL - Abnormal; Notable for the following components:      Result Value   Clue Cells Wet Prep HPF POC PRESENT (*)     All other components within normal limits  CBC WITH DIFFERENTIAL/PLATELET - Abnormal; Notable for the following components:   WBC 10.6 (*)    RDW 16.3 (*)    Platelets 577 (*)    All other components within normal limits  COMPREHENSIVE METABOLIC PANEL WITH GFR - Abnormal; Notable for the following components:   Potassium 2.7 (*)    CO2 19 (*)    Total Protein 8.7 (*)    All other components within normal limits  ACETAMINOPHEN  LEVEL - Abnormal; Notable for the following components:   Acetaminophen  (Tylenol ), Serum <10 (*)    All other components within normal limits  CHLAMYDIA/NGC RT PCR ([facility] ONLY)            HCG, QUANTITATIVE, PREGNANCY  ETHANOL  MAGNESIUM  URINE DRUG SCREEN, QUALITATIVE ([facility] ONLY)  RPR  HIV ANTIBODY (ROUTINE TESTING W REFLEX)  POC URINE PREG, ED     EKG EKG independently reviewed interpreted by myself (ER attending) demonstrates:  EKG demonstrate sinus rhythm rate 94, PR 151, QRS 84, QTc 459, no acute ST  changes  RADIOLOGY Imaging independently reviewed and interpreted by myself demonstrates:  CT head without acute bleed CXR without focal consolidation  Formal Radiology Read:  [facility] Chest Port 1 View Result Date: 10/22/2023 CLINICAL DATA:  Chest wall pain. EXAM: PORTABLE CHEST 1 VIEW COMPARISON:  CT chest dated 10/02/2023. FINDINGS: The heart size and mediastinal contours are within normal limits. No focal consolidation, sizeable pleural effusion, or pneumothorax. Cholecystectomy clips in the right upper quadrant. No acute osseous abnormality. IMPRESSION: No acute cardiopulmonary findings. Electronically Signed   By: Mannie Seek M.D.   On: 10/22/2023 12:23   CT Head Wo Contrast Result Date: 10/22/2023 CLINICAL DATA:  33 year old female with altered mental status and fall. EXAM: CT HEAD WITHOUT CONTRAST TECHNIQUE: Contiguous axial images were obtained from the base of the skull through the vertex without intravenous contrast. RADIATION DOSE REDUCTION: This  exam was performed according to the departmental dose-optimization program which includes automated exposure control, adjustment of the mA and/or kV according to patient size and/or use of iterative reconstruction technique. COMPARISON:  Head CT 12/22/2007. FINDINGS: Brain: No midline shift, ventriculomegaly, mass effect, evidence of mass lesion, intracranial hemorrhage or evidence of cortically based acute infarction. Gray-white matter differentiation is within normal limits throughout the brain. Vascular: No suspicious intracranial vascular hyperdensity. Skull: Intact.  No acute osseous abnormality identified. Sinuses/Orbits: Mild bubbly opacity in the right sphenoid, mild chronic left sphenoid mucosal thickening. Other Visualized paranasal sinuses and mastoids are stable and well aerated. Other: No acute orbit or scalp soft tissue injury identified. IMPRESSION: 1. Normal noncontrast CT appearance of the brain. No acute traumatic injury identified. 2. Mild sphenoid sinus inflammation. Electronically Signed   By: Marlise Simpers M.D.   On: 10/22/2023 09:15    PROCEDURES:  Critical Care performed: No  Procedures   MEDICATIONS ORDERED IN ED: Medications  ketorolac  (TORADOL ) 15 MG/ML injection 15 mg (15 mg Intramuscular Given 10/22/23 0914)  hydrOXYzine  (ATARAX ) tablet 50 mg (50 mg Oral Given 10/22/23 0913)  ondansetron  (ZOFRAN -ODT) disintegrating tablet 4 mg (4 mg Oral Given 10/22/23 0914)  cloNIDine  (CATAPRES ) tablet 0.1 mg (0.1 mg Oral Given 10/22/23 0913)  potassium chloride  SA (KLOR-CON  M) CR tablet 40 mEq (40 mEq Oral Given 10/22/23 1042)  potassium chloride  SA (KLOR-CON  M) CR tablet 40 mEq (40 mEq Oral Given 10/22/23 1343)     IMPRESSION / MDM / ASSESSMENT AND PLAN / ED COURSE  I reviewed the triage vital signs and the nursing notes.  Differential diagnosis includes, but is not limited to, drug intoxication or withdrawal, intracranial bleed, no other obvious signs of acute trauma on exam, anemia,  electrolyte abnormality  Patient's presentation is most consistent with acute presentation with potential threat to life or bodily function.  33 year old female presenting with altered mental status.  Reports opiate use with concerns for withdrawal.  Suspect possible alcohol intoxication as well.  Will obtain labs, head CT to further evaluate.  Discussed Suboxone, patient reports precipitated withdrawal with this in the past and does not wish to trial this.  Will trial adjunctive treatment with Zofran , clonidine , hydroxyzine , and ibuprofen.  1316 On reassessment, patient does have improvement.  Labs notable for hypokalemia for which patient received oral repletion.  Imaging reassuring.  Will DC with additional short course of potassium repletion, adjunctive therapies.  Patient additionally did request STD testing prior to discharge.  Notified that she will be contacted if these returned positive.  Strict return precautions provided.  Patient discharged in stable condition.  Addendum 1342: Wet prep  resulted with clue cells. Will dc with prescription for flagyl  x7 days as well.       FINAL CLINICAL IMPRESSION(S) / ED DIAGNOSES   Final diagnoses:  Opiate withdrawal (HCC)  Hypokalemia  Bacterial vaginosis     Rx / DC Orders   ED Discharge Orders          Ordered    potassium chloride  SA (KLOR-CON  M) 20 MEQ tablet  2 times daily        10/22/23 1318    ondansetron  (ZOFRAN ) 4 MG tablet  Every 6 hours PRN        10/22/23 1318    hydrOXYzine  (VISTARIL ) 50 MG capsule  3 times daily PRN        10/22/23 1318    dicyclomine  (BENTYL ) 10 MG capsule  3 times daily PRN        10/22/23 1318    metroNIDAZOLE  (FLAGYL ) 500 MG tablet  2 times daily        10/22/23 1343             Note:  This document was prepared using Dragon voice recognition software and may include unintentional dictation errors.   Claria Crofts, MD 10/22/23 1322    Claria Crofts, MD 10/22/23 1325    Claria Crofts,  MD 10/22/23 1343

## 2023-10-22 NOTE — ED Triage Notes (Signed)
 Pt to ED from [facility] with AMS and fall. Unknown LOC, pt respirations even unlabored, pupils equal/reactive, able to give her name.

## 2023-10-22 NOTE — ED Notes (Signed)
 Pt taken to ct via stretcher with ct tech, pt given a pillow to hold, pt states that it helps her back not hurt as bad when she holds the pillow
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
## 2023-10-22 NOTE — Discharge Instructions (Signed)
Return to the ER for new or worsening symptoms. °

## 2023-10-22 NOTE — Discharge Instructions (Addendum)
 I have sent medication to help replace your low potassium.  Have also sent a couple medications that you can take as needed for your withdrawal symptoms.  Follow with your primary care doctor for further evaluation.  Return to the ER for new or worsening symptoms.

## 2023-10-23 LAB — RPR: RPR Ser Ql: NONREACTIVE

## 2023-11-12 ENCOUNTER — Encounter (HOSPITAL_COMMUNITY): Payer: Self-pay

## 2023-11-12 ENCOUNTER — Emergency Department (HOSPITAL_COMMUNITY)
Admission: EM | Admit: 2023-11-12 | Discharge: 2023-11-13 | Discharge status: Against Medical Advice | Payer: MEDICAID | Attending: Emergency Medicine | Admitting: Emergency Medicine

## 2023-11-12 ENCOUNTER — Other Ambulatory Visit: Payer: Self-pay

## 2023-11-12 DIAGNOSIS — R110 Nausea: Secondary | ICD-10-CM | POA: Insufficient documentation

## 2023-11-12 DIAGNOSIS — Z5900 Homelessness unspecified: Secondary | ICD-10-CM | POA: Diagnosis not present

## 2023-11-12 DIAGNOSIS — Z5321 Procedure and treatment not carried out due to patient leaving prior to being seen by health care provider: Secondary | ICD-10-CM | POA: Diagnosis not present

## 2023-11-12 DIAGNOSIS — R252 Cramp and spasm: Secondary | ICD-10-CM | POA: Diagnosis present

## 2023-11-12 LAB — CBC WITH DIFFERENTIAL/PLATELET
Abs Immature Granulocytes: 0.02 10*3/uL (ref 0.00–0.07)
Basophils Absolute: 0 10*3/uL (ref 0.0–0.1)
Basophils Relative: 0 %
Eosinophils Absolute: 0 10*3/uL (ref 0.0–0.5)
Eosinophils Relative: 0 %
HCT: 37.7 % (ref 36.0–46.0)
Hemoglobin: 12.1 g/dL (ref 12.0–15.0)
Immature Granulocytes: 0 %
Lymphocytes Relative: 20 %
Lymphs Abs: 1.7 10*3/uL (ref 0.7–4.0)
MCH: 26.8 pg (ref 26.0–34.0)
MCHC: 32.1 g/dL (ref 30.0–36.0)
MCV: 83.4 fL (ref 80.0–100.0)
Monocytes Absolute: 0.3 10*3/uL (ref 0.1–1.0)
Monocytes Relative: 4 %
Neutro Abs: 6.3 10*3/uL (ref 1.7–7.7)
Neutrophils Relative %: 76 %
Platelets: 388 10*3/uL (ref 150–400)
RBC: 4.52 MIL/uL (ref 3.87–5.11)
RDW: 17.1 % — ABNORMAL HIGH (ref 11.5–15.5)
WBC: 8.3 10*3/uL (ref 4.0–10.5)
nRBC: 0 % (ref 0.0–0.2)

## 2023-11-12 LAB — RAPID URINE DRUG SCREEN, HOSP PERFORMED
Amphetamines: POSITIVE — AB
Barbiturates: NOT DETECTED
Benzodiazepines: POSITIVE — AB
Cocaine: POSITIVE — AB
Opiates: NOT DETECTED
Tetrahydrocannabinol: POSITIVE — AB

## 2023-11-12 LAB — COMPREHENSIVE METABOLIC PANEL WITH GFR
ALT: 35 U/L (ref 0–44)
AST: 32 U/L (ref 15–41)
Albumin: 4 g/dL (ref 3.5–5.0)
Alkaline Phosphatase: 68 U/L (ref 38–126)
Anion gap: 11 (ref 5–15)
BUN: 9 mg/dL (ref 6–20)
CO2: 21 mmol/L — ABNORMAL LOW (ref 22–32)
Calcium: 9.4 mg/dL (ref 8.9–10.3)
Chloride: 108 mmol/L (ref 98–111)
Creatinine, Ser: 0.84 mg/dL (ref 0.44–1.00)
GFR, Estimated: >60 mL/min (ref >60)
Glucose, Bld: 112 mg/dL — ABNORMAL HIGH (ref 70–99)
Potassium: 3.3 mmol/L — ABNORMAL LOW (ref 3.5–5.1)
Sodium: 140 mmol/L (ref 135–145)
Total Bilirubin: 0.7 mg/dL (ref 0.0–1.2)
Total Protein: 8.2 g/dL — ABNORMAL HIGH (ref 6.5–8.1)

## 2023-11-12 LAB — HIV ANTIBODY (ROUTINE TESTING W REFLEX): HIV Screen 4th Generation wRfx: NONREACTIVE

## 2023-11-12 LAB — ETHANOL: Alcohol, Ethyl (B): <15 mg/dL (ref <15)

## 2023-11-12 MED ORDER — BUPRENORPHINE HCL 8 MG SL SUBL
8.0000 mg | SUBLINGUAL_TABLET | SUBLINGUAL | Status: AC
  Administered 2023-11-12: 8 mg via SUBLINGUAL
  Filled 2023-11-12: qty 1
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
## 2023-11-12 NOTE — ED Triage Notes (Addendum)
 Pt to ED via [employer] from bus stop requesting detox from fentanyl , meth, cocaine, reports used meth yesterday, fentanyl 3 days ago, cocaine this morning. Paranoia with [employer]. HX: Mental health problems. No medications given by EMS.   Pt also reports she is homeless

## 2023-11-13 LAB — RPR: RPR Ser Ql: NONREACTIVE

## 2023-11-13 NOTE — ED Notes (Signed)
Patient was called 3 times no answer. 

## 2023-11-13 NOTE — BH Assessment (Signed)
 Per Lida Reeks, EMT, she does not see the pt, she thinks they left the ED. Pt's TTS consult to be removed.    Rosi Converse, MS, [facility], [facility] Triage Specialist [phone]

## 2023-11-13 NOTE — BH Assessment (Signed)
 Clinician messaged Michaeleen Adler, RN and Lida Reeks, EMT: Mary [name]. It's Trey with TTS. Is the pt able to engage in the assessment, if so the pt will need to be placed in a private room. Is the pt under IVC? Also is the pt medically cleared?   Clinician awaiting response.   There's no EDP note in the chart.   Rosi Converse, MS, [facility], [facility] Triage Specialist [phone]

## 2023-11-14 ENCOUNTER — Other Ambulatory Visit: Payer: Self-pay

## 2023-11-14 ENCOUNTER — Emergency Department (HOSPITAL_COMMUNITY): Admission: EM | Admit: 2023-11-14 | Discharge: 2023-11-15 | Discharge status: Against Medical Advice | Payer: MEDICAID

## 2023-11-14 ENCOUNTER — Encounter (HOSPITAL_COMMUNITY): Payer: Self-pay | Admitting: Emergency Medicine

## 2023-11-14 DIAGNOSIS — F1123 Opioid dependence with withdrawal: Secondary | ICD-10-CM | POA: Insufficient documentation

## 2023-11-14 DIAGNOSIS — R0602 Shortness of breath: Secondary | ICD-10-CM | POA: Insufficient documentation

## 2023-11-14 DIAGNOSIS — R0789 Other chest pain: Secondary | ICD-10-CM | POA: Insufficient documentation

## 2023-11-14 DIAGNOSIS — Z5321 Procedure and treatment not carried out due to patient leaving prior to being seen by health care provider: Secondary | ICD-10-CM | POA: Insufficient documentation

## 2023-11-14 LAB — GC/CHLAMYDIA PROBE AMP (~~LOC~~) NOT AT ARMC
Chlamydia: NEGATIVE
Comment: NEGATIVE
Comment: NORMAL
Neisseria Gonorrhea: NEGATIVE

## 2023-11-14 MED ORDER — BUPRENORPHINE HCL 2 MG SL SUBL
2.0000 mg | SUBLINGUAL_TABLET | Freq: Once | SUBLINGUAL | Status: AC
  Administered 2023-11-14: 2 mg via SUBLINGUAL
  Filled 2023-11-14: qty 1

## 2023-11-14 NOTE — ED Triage Notes (Signed)
 Pt reports she is withdrawing from fentanyl. Last use was this morning. Pt reports emesis and body aches.

## 2023-11-14 NOTE — ED Notes (Signed)
 Pt called x2 for room, pt did not answer
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
## 2023-11-15 ENCOUNTER — Emergency Department (HOSPITAL_COMMUNITY): Payer: MEDICAID

## 2023-11-15 ENCOUNTER — Encounter (HOSPITAL_COMMUNITY): Admission: EM | Disposition: A | Discharge status: Home / Self Care | Payer: Self-pay | Source: Home / Self Care

## 2023-11-15 ENCOUNTER — Inpatient Hospital Stay (HOSPITAL_COMMUNITY)
Admission: EM | Admit: 2023-11-15 | Discharge: 2023-11-22 | DRG: 492 | Disposition: A | Discharge status: Home / Self Care | Payer: MEDICAID | Attending: Surgery | Admitting: Surgery

## 2023-11-15 ENCOUNTER — Other Ambulatory Visit: Payer: Self-pay

## 2023-11-15 ENCOUNTER — Inpatient Hospital Stay (HOSPITAL_COMMUNITY): Payer: MEDICAID

## 2023-11-15 ENCOUNTER — Encounter (HOSPITAL_COMMUNITY): Payer: Self-pay | Admitting: Emergency Medicine

## 2023-11-15 DIAGNOSIS — S82891B Other fracture of right lower leg, initial encounter for open fracture type I or II: Secondary | ICD-10-CM

## 2023-11-15 DIAGNOSIS — T1490XA Injury, unspecified, initial encounter: Secondary | ICD-10-CM | POA: Diagnosis present

## 2023-11-15 DIAGNOSIS — Z23 Encounter for immunization: Secondary | ICD-10-CM | POA: Diagnosis not present

## 2023-11-15 DIAGNOSIS — S82831B Other fracture of upper and lower end of right fibula, initial encounter for open fracture type I or II: Secondary | ICD-10-CM | POA: Diagnosis present

## 2023-11-15 DIAGNOSIS — E876 Hypokalemia: Secondary | ICD-10-CM | POA: Diagnosis present

## 2023-11-15 DIAGNOSIS — Z5902 Unsheltered homelessness: Secondary | ICD-10-CM

## 2023-11-15 DIAGNOSIS — B1920 Unspecified viral hepatitis C without hepatic coma: Secondary | ICD-10-CM | POA: Diagnosis present

## 2023-11-15 DIAGNOSIS — S3991XA Unspecified injury of abdomen, initial encounter: Secondary | ICD-10-CM | POA: Diagnosis present

## 2023-11-15 DIAGNOSIS — Y92410 Unspecified street and highway as the place of occurrence of the external cause: Secondary | ICD-10-CM | POA: Diagnosis not present

## 2023-11-15 DIAGNOSIS — F17210 Nicotine dependence, cigarettes, uncomplicated: Secondary | ICD-10-CM | POA: Diagnosis present

## 2023-11-15 DIAGNOSIS — S82871B Displaced pilon fracture of right tibia, initial encounter for open fracture type I or II: Secondary | ICD-10-CM | POA: Diagnosis present

## 2023-11-15 DIAGNOSIS — M21961 Unspecified acquired deformity of right lower leg: Secondary | ICD-10-CM | POA: Diagnosis present

## 2023-11-15 DIAGNOSIS — Z7901 Long term (current) use of anticoagulants: Secondary | ICD-10-CM | POA: Diagnosis not present

## 2023-11-15 DIAGNOSIS — S9304XA Dislocation of right ankle joint, initial encounter: Secondary | ICD-10-CM | POA: Diagnosis present

## 2023-11-15 DIAGNOSIS — S5002XA Contusion of left elbow, initial encounter: Secondary | ICD-10-CM | POA: Diagnosis present

## 2023-11-15 DIAGNOSIS — N179 Acute kidney failure, unspecified: Secondary | ICD-10-CM | POA: Diagnosis present

## 2023-11-15 DIAGNOSIS — D62 Acute posthemorrhagic anemia: Secondary | ICD-10-CM | POA: Diagnosis not present

## 2023-11-15 HISTORY — PX: IRRIGATION AND DEBRIDEMENT POSTERIOR HIP: SHX7265

## 2023-11-15 HISTORY — PX: EXTERNAL FIXATION, ANKLE: SHX7570

## 2023-11-15 LAB — HIV ANTIBODY (ROUTINE TESTING W REFLEX): HIV Screen 4th Generation wRfx: NONREACTIVE

## 2023-11-15 LAB — PROTIME-INR
INR: 1.1 (ref 0.8–1.2)
Prothrombin Time: 14.5 s (ref 11.4–15.2)

## 2023-11-15 LAB — COMPREHENSIVE METABOLIC PANEL WITH GFR
ALT: 30 U/L (ref 0–44)
AST: 41 U/L (ref 15–41)
Albumin: 3.7 g/dL (ref 3.5–5.0)
Alkaline Phosphatase: 61 U/L (ref 38–126)
Anion gap: 14 (ref 5–15)
BUN: 19 mg/dL (ref 6–20)
CO2: 16 mmol/L — ABNORMAL LOW (ref 22–32)
Calcium: 8.6 mg/dL — ABNORMAL LOW (ref 8.9–10.3)
Chloride: 108 mmol/L (ref 98–111)
Creatinine, Ser: 0.91 mg/dL (ref 0.44–1.00)
GFR, Estimated: >60 mL/min (ref >60)
Glucose, Bld: 92 mg/dL (ref 70–99)
Potassium: 4.7 mmol/L (ref 3.5–5.1)
Sodium: 138 mmol/L (ref 135–145)
Total Bilirubin: 1 mg/dL (ref 0.0–1.2)
Total Protein: 7.2 g/dL (ref 6.5–8.1)

## 2023-11-15 LAB — ETHANOL: Alcohol, Ethyl (B): <15 mg/dL (ref <15)

## 2023-11-15 LAB — I-STAT CHEM 8, ED
BUN: 19 mg/dL (ref 6–20)
Calcium, Ion: 1.16 mmol/L (ref 1.15–1.40)
Chloride: 103 mmol/L (ref 98–111)
Creatinine, Ser: 0.8 mg/dL (ref 0.44–1.00)
Glucose, Bld: 335 mg/dL — ABNORMAL HIGH (ref 70–99)
HCT: 35 % — ABNORMAL LOW (ref 36.0–46.0)
Hemoglobin: 11.9 g/dL — ABNORMAL LOW (ref 12.0–15.0)
Potassium: 2.9 mmol/L — ABNORMAL LOW (ref 3.5–5.1)
Sodium: 138 mmol/L (ref 135–145)
TCO2: 18 mmol/L — ABNORMAL LOW (ref 22–32)

## 2023-11-15 LAB — SAMPLE TO BLOOD BANK

## 2023-11-15 LAB — CBC
HCT: 37.1 % (ref 36.0–46.0)
Hemoglobin: 11.8 g/dL — ABNORMAL LOW (ref 12.0–15.0)
MCH: 27.4 pg (ref 26.0–34.0)
MCHC: 31.8 g/dL (ref 30.0–36.0)
MCV: 86.1 fL (ref 80.0–100.0)
Platelets: 364 10*3/uL (ref 150–400)
RBC: 4.31 MIL/uL (ref 3.87–5.11)
RDW: 17.2 % — ABNORMAL HIGH (ref 11.5–15.5)
WBC: 18.6 10*3/uL — ABNORMAL HIGH (ref 4.0–10.5)
nRBC: 0 % (ref 0.0–0.2)

## 2023-11-15 LAB — I-STAT CG4 LACTIC ACID, ED: Lactic Acid, Venous: 2.3 mmol/L (ref 0.5–1.9)

## 2023-11-15 SURGERY — IRRIGATION AND DEBRIDEMENT, OPEN FRACTURE
Anesthesia: General | Laterality: Right

## 2023-11-15 MED ORDER — ACETAMINOPHEN 500 MG PO TABS
1000.0000 mg | ORAL_TABLET | Freq: Four times a day (QID) | ORAL | Status: DC
Start: 2023-11-15 — End: 2023-11-16
  Administered 2023-11-16: 1000 mg
  Filled 2023-11-15 (×2): qty 2

## 2023-11-15 MED ORDER — FENTANYL CITRATE (PF) 100 MCG/2ML IJ SOLN
INTRAMUSCULAR | Status: AC
Start: 2023-11-15 — End: 2023-11-15
  Filled 2023-11-15: qty 2

## 2023-11-15 MED ORDER — DOCUSATE SODIUM 50 MG/5ML PO LIQD
100.0000 mg | Freq: Two times a day (BID) | ORAL | Status: DC
Start: 2023-11-15 — End: 2023-11-16
  Administered 2023-11-15: 100 mg
  Filled 2023-11-15 (×2): qty 10

## 2023-11-15 MED ORDER — PROPOFOL 10 MG/ML IV BOLUS
INTRAVENOUS | Status: AC
Start: 2023-11-15 — End: 2023-11-15
  Filled 2023-11-15: qty 20

## 2023-11-15 MED ORDER — ACETAMINOPHEN 10 MG/ML IV SOLN
INTRAVENOUS | Status: DC | PRN
Start: 2023-11-15 — End: 2023-11-15
  Administered 2023-11-15: 1000 mg via INTRAVENOUS

## 2023-11-15 MED ORDER — KETAMINE HCL 50 MG/5ML IJ SOSY
PREFILLED_SYRINGE | INTRAMUSCULAR | Status: AC
  Administered 2023-11-15: 25 mg via INTRAVENOUS
  Filled 2023-11-15: qty 5

## 2023-11-15 MED ORDER — MIDAZOLAM HCL 2 MG/2ML IJ SOLN
2.0000 mg | Freq: Once | INTRAMUSCULAR | Status: AC
  Administered 2023-11-15: 2 mg via INTRAVENOUS

## 2023-11-15 MED ORDER — HYDROMORPHONE 1 MG/ML IV SOLN: INTRAVENOUS | Status: DC

## 2023-11-15 MED ORDER — PANTOPRAZOLE SODIUM 40 MG IV SOLR
40.0000 mg | Freq: Every day | INTRAVENOUS | Status: DC
  Administered 2023-11-16: 40 mg via INTRAVENOUS
  Filled 2023-11-15: qty 10

## 2023-11-15 MED ORDER — KETAMINE HCL 50 MG/5ML IJ SOSY
PREFILLED_SYRINGE | INTRAMUSCULAR | Status: AC
  Filled 2023-11-15: qty 5

## 2023-11-15 MED ORDER — POLYETHYLENE GLYCOL 3350 17 G PO PACK: 17.0000 g | PACK | Freq: Every day | ORAL | Status: DC | PRN

## 2023-11-15 MED ORDER — PROPOFOL 1000 MG/100ML IV EMUL
0.0000 ug/kg/min | INTRAVENOUS | Status: DC
  Filled 2023-11-15: qty 100

## 2023-11-15 MED ORDER — FENTANYL CITRATE PF 50 MCG/ML IJ SOSY
PREFILLED_SYRINGE | INTRAMUSCULAR | Status: AC
  Administered 2023-11-15: 100 ug via INTRAVENOUS
  Filled 2023-11-15: qty 2

## 2023-11-15 MED ORDER — DIPHENHYDRAMINE HCL 50 MG/ML IJ SOLN
12.5000 mg | Freq: Four times a day (QID) | INTRAMUSCULAR | Status: DC | PRN
  Administered 2023-11-15: 12.5 mg via INTRAVENOUS
  Filled 2023-11-15: qty 1

## 2023-11-15 MED ORDER — DOCUSATE SODIUM 100 MG PO CAPS: 100.0000 mg | ORAL_CAPSULE | Freq: Two times a day (BID) | ORAL | Status: DC

## 2023-11-15 MED ORDER — DEXMEDETOMIDINE HCL IN NACL 80 MCG/20ML IV SOLN
INTRAVENOUS | Status: DC | PRN
  Administered 2023-11-15: 20 ug via INTRAVENOUS
  Administered 2023-11-15: 12 ug via INTRAVENOUS

## 2023-11-15 MED ORDER — FENTANYL CITRATE (PF) 100 MCG/2ML IJ SOLN
25.0000 ug | INTRAMUSCULAR | Status: DC | PRN
  Administered 2023-11-15 (×3): 50 ug via INTRAVENOUS

## 2023-11-15 MED ORDER — ACETAMINOPHEN 10 MG/ML IV SOLN: 1000.0000 mg | Freq: Once | INTRAVENOUS | Status: DC | PRN

## 2023-11-15 MED ORDER — TETANUS-DIPHTHERIA TOXOIDS TD 5-2 LFU IM INJ: 0.5000 mL | INJECTION | Freq: Once | INTRAMUSCULAR | Status: DC

## 2023-11-15 MED ORDER — POLYETHYLENE GLYCOL 3350 17 G PO PACK
17.0000 g | PACK | Freq: Every day | ORAL | Status: DC
  Filled 2023-11-15: qty 1

## 2023-11-15 MED ORDER — METOPROLOL TARTRATE 5 MG/5ML IV SOLN: 5.0000 mg | Freq: Four times a day (QID) | INTRAVENOUS | Status: DC | PRN

## 2023-11-15 MED ORDER — HALOPERIDOL LACTATE 5 MG/ML IJ SOLN
5.0000 mg | Freq: Four times a day (QID) | INTRAMUSCULAR | Status: DC | PRN
  Administered 2023-11-15 – 2023-11-19 (×5): 5 mg via INTRAVENOUS
  Filled 2023-11-15 (×5): qty 1

## 2023-11-15 MED ORDER — OXYCODONE HCL 5 MG PO TABS
10.0000 mg | ORAL_TABLET | ORAL | Status: DC | PRN
  Administered 2023-11-15: 10 mg via ORAL
  Filled 2023-11-15: qty 2

## 2023-11-15 MED ORDER — OXYCODONE HCL 5 MG PO TABS: 5.0000 mg | ORAL_TABLET | Freq: Once | ORAL | Status: DC | PRN

## 2023-11-15 MED ORDER — ROCURONIUM BROMIDE 10 MG/ML (PF) SYRINGE
PREFILLED_SYRINGE | INTRAVENOUS | Status: DC | PRN
  Administered 2023-11-15: 50 mg via INTRAVENOUS

## 2023-11-15 MED ORDER — OXYCODONE HCL 5 MG/5ML PO SOLN: 5.0000 mg | Freq: Once | ORAL | Status: DC | PRN

## 2023-11-15 MED ORDER — FENTANYL CITRATE PF 50 MCG/ML IJ SOSY
25.0000 ug | PREFILLED_SYRINGE | Freq: Once | INTRAMUSCULAR | Status: AC
  Administered 2023-11-15: 50 ug via INTRAVENOUS

## 2023-11-15 MED ORDER — BUPIVACAINE LIPOSOME 1.3 % IJ SUSP
INTRAMUSCULAR | Status: AC
  Filled 2023-11-15: qty 20

## 2023-11-15 MED ORDER — VANCOMYCIN HCL 1000 MG IV SOLR
INTRAVENOUS | Status: DC | PRN
  Administered 2023-11-15: 1000 mg via TOPICAL

## 2023-11-15 MED ORDER — ONDANSETRON 4 MG PO TBDP: 4.0000 mg | ORAL_TABLET | Freq: Four times a day (QID) | ORAL | Status: DC | PRN

## 2023-11-15 MED ORDER — DIPHENHYDRAMINE HCL 12.5 MG/5ML PO ELIX: 12.5000 mg | ORAL_SOLUTION | Freq: Four times a day (QID) | ORAL | Status: DC | PRN

## 2023-11-15 MED ORDER — FENTANYL CITRATE (PF) 250 MCG/5ML IJ SOLN
INTRAMUSCULAR | Status: DC | PRN
  Administered 2023-11-15: 50 ug via INTRAVENOUS
  Administered 2023-11-15: 100 ug via INTRAVENOUS

## 2023-11-15 MED ORDER — FENTANYL BOLUS VIA INFUSION: 25.0000 ug | INTRAVENOUS | Status: DC | PRN

## 2023-11-15 MED ORDER — PHENYLEPHRINE 80 MCG/ML (10ML) SYRINGE FOR IV PUSH (FOR BLOOD PRESSURE SUPPORT)
PREFILLED_SYRINGE | INTRAVENOUS | Status: DC | PRN
  Administered 2023-11-15: 80 ug via INTRAVENOUS
  Administered 2023-11-15: 160 ug via INTRAVENOUS

## 2023-11-15 MED ORDER — PANTOPRAZOLE SODIUM 40 MG PO TBEC
40.0000 mg | DELAYED_RELEASE_TABLET | Freq: Every day | ORAL | Status: DC
  Administered 2023-11-17 – 2023-11-22 (×5): 40 mg via ORAL
  Filled 2023-11-15 (×5): qty 1

## 2023-11-15 MED ORDER — FENTANYL CITRATE (PF) 250 MCG/5ML IJ SOLN
INTRAMUSCULAR | Status: AC
  Filled 2023-11-15: qty 5

## 2023-11-15 MED ORDER — ONDANSETRON HCL 4 MG/2ML IJ SOLN: 4.0000 mg | Freq: Four times a day (QID) | INTRAMUSCULAR | Status: DC | PRN

## 2023-11-15 MED ORDER — HYDRALAZINE HCL 20 MG/ML IJ SOLN: 10.0000 mg | INTRAMUSCULAR | Status: DC | PRN

## 2023-11-15 MED ORDER — FENTANYL 2500MCG IN NS 250ML (10MCG/ML) PREMIX INFUSION
0.0000 ug/h | INTRAVENOUS | Status: DC
  Administered 2023-11-15: 150 ug/h via INTRAVENOUS
  Filled 2023-11-15: qty 250

## 2023-11-15 MED ORDER — FENTANYL CITRATE (PF) 100 MCG/2ML IJ SOLN
INTRAMUSCULAR | Status: AC
  Filled 2023-11-15: qty 2

## 2023-11-15 MED ORDER — FENTANYL CITRATE PF 50 MCG/ML IJ SOSY: 50.0000 ug | PREFILLED_SYRINGE | INTRAMUSCULAR | Status: DC | PRN

## 2023-11-15 MED ORDER — KETAMINE HCL 10 MG/ML IJ SOLN
INTRAMUSCULAR | Status: DC | PRN
  Administered 2023-11-15: 20 mg via INTRAVENOUS

## 2023-11-15 MED ORDER — ETOMIDATE 2 MG/ML IV SOLN
INTRAVENOUS | Status: AC | PRN
  Administered 2023-11-15: 20 mg via INTRAVENOUS

## 2023-11-15 MED ORDER — METHOCARBAMOL 1000 MG/10ML IJ SOLN
500.0000 mg | Freq: Three times a day (TID) | INTRAMUSCULAR | Status: DC
  Administered 2023-11-15 – 2023-11-16 (×2): 500 mg via INTRAVENOUS
  Filled 2023-11-15 (×3): qty 10

## 2023-11-15 MED ORDER — OXYCODONE HCL 5 MG PO TABS
10.0000 mg | ORAL_TABLET | ORAL | Status: DC | PRN
  Administered 2023-11-16: 10 mg
  Filled 2023-11-15 (×2): qty 2

## 2023-11-15 MED ORDER — BUPIVACAINE LIPOSOME 1.3 % IJ SUSP
INTRAMUSCULAR | Status: DC | PRN
  Administered 2023-11-15: 20 mL

## 2023-11-15 MED ORDER — VANCOMYCIN HCL 1000 MG IV SOLR
INTRAVENOUS | Status: AC
  Filled 2023-11-15: qty 20

## 2023-11-15 MED ORDER — SODIUM CHLORIDE 0.9% FLUSH: 9.0000 mL | INTRAVENOUS | Status: DC | PRN

## 2023-11-15 MED ORDER — NALOXONE HCL 0.4 MG/ML IJ SOLN: 0.4000 mg | INTRAMUSCULAR | Status: DC | PRN

## 2023-11-15 MED ORDER — DEXAMETHASONE SODIUM PHOSPHATE 10 MG/ML IJ SOLN
INTRAMUSCULAR | Status: DC | PRN
  Administered 2023-11-15: 10 mg via INTRAVENOUS

## 2023-11-15 MED ORDER — IOHEXOL 350 MG/ML SOLN
75.0000 mL | Freq: Once | INTRAVENOUS | Status: AC | PRN
  Administered 2023-11-15: 75 mL via INTRAVENOUS

## 2023-11-15 MED ORDER — PROPOFOL 1000 MG/100ML IV EMUL
0.0000 ug/kg/min | INTRAVENOUS | Status: DC
  Administered 2023-11-15: 15 ug/kg/min via INTRAVENOUS

## 2023-11-15 MED ORDER — METHOCARBAMOL 500 MG PO TABS: 500.0000 mg | ORAL_TABLET | Freq: Three times a day (TID) | ORAL | Status: DC

## 2023-11-15 MED ORDER — LACTATED RINGERS IV SOLN: INTRAVENOUS | Status: AC

## 2023-11-15 MED ORDER — SUGAMMADEX SODIUM 200 MG/2ML IV SOLN
INTRAVENOUS | Status: DC | PRN
  Administered 2023-11-15: 200 mg via INTRAVENOUS

## 2023-11-15 MED ORDER — MIDAZOLAM HCL 2 MG/2ML IJ SOLN
INTRAMUSCULAR | Status: AC
  Filled 2023-11-15: qty 2

## 2023-11-15 MED ORDER — ACETAMINOPHEN 10 MG/ML IV SOLN
INTRAVENOUS | Status: AC
  Filled 2023-11-15: qty 100

## 2023-11-15 MED ORDER — ROCURONIUM BROMIDE 10 MG/ML (PF) SYRINGE
PREFILLED_SYRINGE | INTRAVENOUS | Status: AC | PRN
  Administered 2023-11-15: 90 mg via INTRAVENOUS

## 2023-11-15 MED ORDER — LACTATED RINGERS IV SOLN: INTRAVENOUS | Status: DC | PRN

## 2023-11-15 MED ORDER — 0.9 % SODIUM CHLORIDE (POUR BTL) OPTIME
TOPICAL | Status: DC | PRN
  Administered 2023-11-15: 1000 mL

## 2023-11-15 MED ORDER — CEFAZOLIN SODIUM-DEXTROSE 2-4 GM/100ML-% IV SOLN
2.0000 g | Freq: Once | INTRAVENOUS | Status: AC
  Administered 2023-11-15: 2 g via INTRAVENOUS

## 2023-11-15 MED ORDER — DROPERIDOL 2.5 MG/ML IJ SOLN: 0.6250 mg | Freq: Once | INTRAMUSCULAR | Status: DC | PRN

## 2023-11-15 MED ORDER — ONDANSETRON HCL 4 MG/2ML IJ SOLN
INTRAMUSCULAR | Status: DC | PRN
  Administered 2023-11-15: 4 mg via INTRAVENOUS

## 2023-11-15 SURGICAL SUPPLY — 69 items
ATTACHMENT SWIVEL MAVERICK (EXFIX) IMPLANT
BAG COUNTER SPONGE SURGICOUNT (BAG) ×2 IMPLANT
BAR MAVERICK EXFX 11X350 (EXFIX) IMPLANT
BIT DRILL MED MAVERICK 4 (DRILL) IMPLANT
BLADE SURG 10 STRL SS (BLADE) ×2 IMPLANT
BNDG COHESIVE 4X5 TAN STRL LF (GAUZE/BANDAGES/DRESSINGS) ×2 IMPLANT
BNDG ELASTIC 4X5.8 VLCR STR LF (GAUZE/BANDAGES/DRESSINGS) ×2 IMPLANT
BNDG ELASTIC 6INX 5YD STR LF (GAUZE/BANDAGES/DRESSINGS) ×2 IMPLANT
BNDG GAUZE DERMACEA FLUFF 4 (GAUZE/BANDAGES/DRESSINGS) ×4 IMPLANT
BRUSH SCRUB EZ PLAIN DRY (MISCELLANEOUS) ×4 IMPLANT
CHLORAPREP W/TINT 26 (MISCELLANEOUS) ×2 IMPLANT
CLAMP MAVERICK MULTI PIN 75 (EXFIX) IMPLANT
CLAMP MAVERICK SWIVEL 11-5/6 (CLAMP) IMPLANT
COVER SURGICAL LIGHT HANDLE (MISCELLANEOUS) ×4 IMPLANT
CUFF TOURN SGL QUICK 42 (TOURNIQUET CUFF) IMPLANT
CUFF TRNQT CYL 34X4.125X (TOURNIQUET CUFF) IMPLANT
DRAIN PENROSE .5X12 LATEX STL (DRAIN) IMPLANT
DRAPE C-ARM 42X72 X-RAY (DRAPES) IMPLANT
DRAPE C-ARMOR (DRAPES) ×2 IMPLANT
DRAPE IMP U-DRAPE 54X76 (DRAPES) ×4 IMPLANT
DRAPE INCISE IOBAN 66X45 STRL (DRAPES) IMPLANT
DRAPE SURG ORHT 6 SPLT 77X108 (DRAPES) ×4 IMPLANT
DRAPE U-SHAPE 47X51 STRL (DRAPES) ×2 IMPLANT
DRSG MEPITEL 4X7.2 (GAUZE/BANDAGES/DRESSINGS) IMPLANT
DURAPREP 26ML APPLICATOR (WOUND CARE) ×2 IMPLANT
ELECT CAUTERY BLADE 6.4 (BLADE) IMPLANT
ELECTRODE REM PT RTRN 9FT ADLT (ELECTROSURGICAL) ×2 IMPLANT
FACESHIELD WRAPAROUND (MASK) ×2 IMPLANT
FACESHIELD WRAPAROUND OR TEAM (MASK) ×4 IMPLANT
GAUZE PACKING IODOFORM 1/2INX (GAUZE/BANDAGES/DRESSINGS) IMPLANT
GAUZE PAD ABD 8X10 STRL (GAUZE/BANDAGES/DRESSINGS) IMPLANT
GAUZE SPONGE 4X4 12PLY STRL (GAUZE/BANDAGES/DRESSINGS) ×2 IMPLANT
GAUZE XEROFORM 5X9 LF (GAUZE/BANDAGES/DRESSINGS) ×2 IMPLANT
GLOVE BIO SURGEON STRL SZ 6.5 (GLOVE) ×6 IMPLANT
GLOVE BIO SURGEON STRL SZ7.5 (GLOVE) ×8 IMPLANT
GLOVE BIO SURGEON STRL SZ8 (GLOVE) ×2 IMPLANT
GLOVE BIOGEL PI IND STRL 6.5 (GLOVE) ×2 IMPLANT
GLOVE BIOGEL PI IND STRL 7.5 (GLOVE) ×2 IMPLANT
GLOVE BIOGEL PI IND STRL 8 (GLOVE) ×2 IMPLANT
GLOVE SURG ENC MOIS LTX SZ6.5 (GLOVE) ×2 IMPLANT
GOWN STRL REUS W/ TWL LRG LVL3 (GOWN DISPOSABLE) ×6 IMPLANT
GOWN STRL REUS W/TWL 2XL LVL3 (GOWN DISPOSABLE) ×2 IMPLANT
KIT BASIN OR (CUSTOM PROCEDURE TRAY) ×2 IMPLANT
KIT TURNOVER KIT B (KITS) ×2 IMPLANT
MANIFOLD NEPTUNE II (INSTRUMENTS) ×2 IMPLANT
NDL 22X1.5 STRL (OR ONLY) (MISCELLANEOUS) IMPLANT
NEEDLE 22X1.5 STRL (OR ONLY) (MISCELLANEOUS) IMPLANT
NS IRRIG 1000ML POUR BTL (IV SOLUTION) ×2 IMPLANT
PACK ORTHO EXTREMITY (CUSTOM PROCEDURE TRAY) ×2 IMPLANT
PAD ARMBOARD POSITIONER FOAM (MISCELLANEOUS) ×4 IMPLANT
PADDING CAST COTTON 6X4 STRL (CAST SUPPLIES) ×4 IMPLANT
PENCIL BUTTON HOLSTER BLD 10FT (ELECTRODE) IMPLANT
PIN GUARD 1.14MM WHITE STERILE (WIRE) IMPLANT
PIN HALF MAVERICK 5X160X35 (EXFIX) IMPLANT
PIN TRANSFIX MAVERICK 5X275 (EXFIX) IMPLANT
SET HNDPC FAN SPRY TIP SCT (DISPOSABLE) IMPLANT
SPONGE T-LAP 18X18 ~~LOC~~+RFID (SPONGE) ×2 IMPLANT
STAPLER SKIN PROX 35W (STAPLE) ×2 IMPLANT
STOCKINETTE IMPERVIOUS 9X36 MD (GAUZE/BANDAGES/DRESSINGS) ×2 IMPLANT
STRIP CLOSURE SKIN 1/2X4 (GAUZE/BANDAGES/DRESSINGS) IMPLANT
SUT ETHILON 2 0 FS 18 (SUTURE) IMPLANT
SUT ETHILON 3 0 PS 1 (SUTURE) IMPLANT
SWAB CULTURE ESWAB REG 1ML (MISCELLANEOUS) IMPLANT
TOWEL GREEN STERILE (TOWEL DISPOSABLE) ×4 IMPLANT
TOWEL GREEN STERILE FF (TOWEL DISPOSABLE) ×4 IMPLANT
TUBE CONNECTING 12X1/4 (SUCTIONS) ×2 IMPLANT
UNDERPAD 30X36 HEAVY ABSORB (UNDERPADS AND DIAPERS) ×2 IMPLANT
WATER STERILE IRR 1000ML POUR (IV SOLUTION) ×2 IMPLANT
YANKAUER SUCT BULB TIP NO VENT (SUCTIONS) ×2 IMPLANT
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
## 2023-11-15 NOTE — Progress Notes (Signed)
 RT transported pt on ventilator from ED [facility] to OR without any issues. RN x2 at bedside.

## 2023-11-15 NOTE — Progress Notes (Signed)
    Patient underwent uneventful I&D of open R ankle fracture with placement of XFIX device. Pin covers placed and pin sites covered with xeroform gauze. Wrapped with kerlix and ACE. Elevate RLE aggressively. Dr Celena to take over for future definitive treatment once swelling improves and additional surgery can be accommodated.  Ileana Clara, PA-C

## 2023-11-15 NOTE — ED Notes (Signed)
Patient transported to CT with TRN.  

## 2023-11-15 NOTE — Transfer of Care (Signed)
 Immediate Anesthesia Transfer of Care Note  Patient: Mary [name]  Procedure(s) Performed: IRRIGATION AND DEBRIDEMENT, OPEN FRACTURE (Right) EXTERNAL FIXATION, ANKLE (Right)  Patient Location: PACU  Anesthesia Type:General  Level of Consciousness: awake and drowsy  Airway & Oxygen Therapy: Patient Spontanous Breathing  Post-op Assessment: Report given to RN, Post -op Vital signs reviewed and stable, and Patient moving all extremities  Post vital signs: Reviewed and stable  Last Vitals:  Vitals Value Taken Time  BP 116/73 11/15/23 17:46  Temp    Pulse 63 11/15/23 17:53  Resp 16 11/15/23 17:53  SpO2 100 % 11/15/23 17:53  Vitals shown include unfiled device data.  Last Pain:  Vitals:   11/15/23 1449  PainSc: 10-Worst pain ever         Complications: No notable events documented.
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
## 2023-11-15 NOTE — Progress Notes (Signed)
 Pt is very much agitated, shouting out loud, crying. Pt not following directives. Haldol  5mg  administered for agitation still to no avail. Pt unable  follow directions at this time, hence not able to focus, comprehend the usage of PCA pump.

## 2023-11-15 NOTE — Progress Notes (Signed)
 RT transported pt from ED Resus to CT and back to ED [facility] without any issues. Trauma team at bedside.
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
## 2023-11-15 NOTE — Progress Notes (Signed)
 Responded to [facility] page to support pt. Pt preparing for Xray. Provided ministry of presence. Chaplain available as needed.  Rayleen Dade, [place], [facility], Pager [phone]
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
## 2023-11-15 NOTE — Progress Notes (Signed)
 Pt arrived on the unit from PACU. Pt alert and oriented. Vital signs stable. RLE aggressively  elevated. No S/S of  distress with this assessment.
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
## 2023-11-16 ENCOUNTER — Inpatient Hospital Stay (HOSPITAL_COMMUNITY): Payer: MEDICAID

## 2023-11-16 LAB — URINALYSIS, ROUTINE W REFLEX MICROSCOPIC
Bilirubin Urine: NEGATIVE
Glucose, UA: NEGATIVE mg/dL
Hgb urine dipstick: NEGATIVE
Ketones, ur: 20 mg/dL — AB
Nitrite: NEGATIVE
Protein, ur: NEGATIVE mg/dL
Specific Gravity, Urine: 1.017 (ref 1.005–1.030)
WBC, UA: >50 WBC/hpf (ref 0–5)
pH: 6 (ref 5.0–8.0)

## 2023-11-16 LAB — CBC
HCT: 31.2 % — ABNORMAL LOW (ref 36.0–46.0)
Hemoglobin: 9.9 g/dL — ABNORMAL LOW (ref 12.0–15.0)
MCH: 26.7 pg (ref 26.0–34.0)
MCHC: 31.7 g/dL (ref 30.0–36.0)
MCV: 84.1 fL (ref 80.0–100.0)
Platelets: 296 10*3/uL (ref 150–400)
RBC: 3.71 MIL/uL — ABNORMAL LOW (ref 3.87–5.11)
RDW: 17.2 % — ABNORMAL HIGH (ref 11.5–15.5)
WBC: 15.7 10*3/uL — ABNORMAL HIGH (ref 4.0–10.5)
nRBC: 0 % (ref 0.0–0.2)

## 2023-11-16 LAB — BASIC METABOLIC PANEL WITH GFR
Anion gap: 9 (ref 5–15)
BUN: 10 mg/dL (ref 6–20)
CO2: 19 mmol/L — ABNORMAL LOW (ref 22–32)
Calcium: 8.2 mg/dL — ABNORMAL LOW (ref 8.9–10.3)
Chloride: 109 mmol/L (ref 98–111)
Creatinine, Ser: 0.77 mg/dL (ref 0.44–1.00)
GFR, Estimated: >60 mL/min (ref >60)
Glucose, Bld: 87 mg/dL (ref 70–99)
Potassium: 3.4 mmol/L — ABNORMAL LOW (ref 3.5–5.1)
Sodium: 137 mmol/L (ref 135–145)

## 2023-11-16 LAB — TRIGLYCERIDES: Triglycerides: 61 mg/dL (ref <150)

## 2023-11-16 MED ORDER — METHOCARBAMOL 1000 MG/10ML IJ SOLN
500.0000 mg | Freq: Three times a day (TID) | INTRAMUSCULAR | Status: AC
  Administered 2023-11-16 – 2023-11-18 (×6): 500 mg via INTRAVENOUS
  Filled 2023-11-16 (×6): qty 10

## 2023-11-16 MED ORDER — DOCUSATE SODIUM 50 MG/5ML PO LIQD
100.0000 mg | Freq: Two times a day (BID) | ORAL | Status: DC
  Administered 2023-11-16 – 2023-11-17 (×4): 100 mg via ORAL
  Filled 2023-11-16 (×3): qty 10

## 2023-11-16 MED ORDER — OXYCODONE HCL 5 MG PO TABS
10.0000 mg | ORAL_TABLET | ORAL | Status: DC | PRN
  Administered 2023-11-16 – 2023-11-19 (×11): 10 mg via ORAL
  Filled 2023-11-16 (×11): qty 2

## 2023-11-16 MED ORDER — SODIUM CHLORIDE 0.9 % IV SOLN
2.0000 g | INTRAVENOUS | Status: AC
  Administered 2023-11-16 – 2023-11-20 (×5): 2 g via INTRAVENOUS
  Filled 2023-11-16 (×5): qty 20

## 2023-11-16 MED ORDER — POTASSIUM CHLORIDE CRYS ER 20 MEQ PO TBCR
20.0000 meq | EXTENDED_RELEASE_TABLET | Freq: Once | ORAL | Status: AC
  Administered 2023-11-16: 20 meq via ORAL
  Filled 2023-11-16: qty 1

## 2023-11-16 MED ORDER — ENOXAPARIN SODIUM 30 MG/0.3ML IJ SOSY
30.0000 mg | PREFILLED_SYRINGE | Freq: Two times a day (BID) | INTRAMUSCULAR | Status: DC
  Administered 2023-11-16 – 2023-11-17 (×3): 30 mg via SUBCUTANEOUS
  Filled 2023-11-16 (×3): qty 0.3

## 2023-11-16 MED ORDER — HYDROMORPHONE HCL 1 MG/ML IJ SOLN
1.0000 mg | INTRAMUSCULAR | Status: DC | PRN
  Administered 2023-11-16: 1 mg via INTRAVENOUS
  Filled 2023-11-16: qty 1

## 2023-11-16 MED ORDER — POLYETHYLENE GLYCOL 3350 17 G PO PACK: 17.0000 g | PACK | Freq: Every day | ORAL | Status: DC | PRN

## 2023-11-16 MED ORDER — POLYETHYLENE GLYCOL 3350 17 G PO PACK
17.0000 g | PACK | Freq: Every day | ORAL | Status: DC
  Administered 2023-11-16 – 2023-11-17 (×2): 17 g via ORAL
  Filled 2023-11-16: qty 1

## 2023-11-16 MED ORDER — HYDROMORPHONE HCL 1 MG/ML IJ SOLN
1.0000 mg | INTRAMUSCULAR | Status: DC | PRN
  Administered 2023-11-17 – 2023-11-20 (×4): 1 mg via INTRAVENOUS
  Filled 2023-11-16 (×4): qty 1

## 2023-11-16 MED ORDER — METHOCARBAMOL 500 MG PO TABS
500.0000 mg | ORAL_TABLET | Freq: Three times a day (TID) | ORAL | Status: AC
  Administered 2023-11-17: 500 mg via ORAL
  Filled 2023-11-16: qty 1

## 2023-11-16 MED ORDER — ACETAMINOPHEN 500 MG PO TABS
1000.0000 mg | ORAL_TABLET | Freq: Four times a day (QID) | ORAL | Status: DC
  Administered 2023-11-16 – 2023-11-22 (×19): 1000 mg via ORAL
  Filled 2023-11-16 (×21): qty 2
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
## 2023-11-16 NOTE — Anesthesia Postprocedure Evaluation (Signed)
 Anesthesia Post Note  Patient: Mary [name]  Procedure(s) Performed: IRRIGATION AND DEBRIDEMENT, OPEN FRACTURE (Right) EXTERNAL FIXATION, ANKLE (Right)     Patient location during evaluation: PACU Anesthesia Type: General Level of consciousness: awake and alert Pain management: pain level controlled Vital Signs Assessment: post-procedure vital signs reviewed and stable Respiratory status: spontaneous breathing, nonlabored ventilation, respiratory function stable and patient connected to nasal cannula oxygen Cardiovascular status: blood pressure returned to baseline and stable Postop Assessment: no apparent nausea or vomiting Anesthetic complications: no   No notable events documented.  Last Vitals:  Vitals:   11/16/23 0300 11/16/23 0311  BP:  108/67  Pulse:  (!) 51  Resp:  15  Temp: 36.9 C 36.9 C  SpO2:  100%    Last Pain:  Vitals:   11/16/23 0544  TempSrc:   PainSc: 7                  Thom JONELLE Peoples

## 2023-11-16 NOTE — Progress Notes (Signed)
 Ortho trauma service  Pt has been seen  Full note to follow  OR Monday or Tuesday for ORIF open R ankle fracture dislocation   Francis MICAEL Mt, PA-C [phone] (C) 11/16/2023, 6:06 PM  [facility] [address] [place] [address] [phone] (F)      Patient ID: Mary [name], female   DOB: Jul 08, 1990, 33 y.o.   MRN: [id number]

## 2023-11-16 NOTE — Progress Notes (Signed)
..  Trauma Event Note    Reason for Call :  [facility] reading room called to check on results of CT completed this am at 0629, they state the order was placed as routine and this is why it has not been read but he will escalate and get the read completed asap.   Last imported Vital Signs BP 116/68   Pulse 60   Temp 98.8 F (37.1 C) (Oral)   Resp 15   Ht 5' 2 (1.575 m)   Wt 132 lb 4.4 oz (60 kg)   LMP  (LMP Unknown)   SpO2 99%   BMI 24.19 kg/m   Trending CBC Recent Labs    11/15/23 1457 11/15/23 1501 11/16/23 0850  WBC  --  18.6* 15.7*  HGB 11.9* 11.8* 9.9*  HCT 35.0* 37.1 31.2*  PLT  --  364 296    Trending Coag's Recent Labs    11/15/23 1501  INR 1.1    Trending BMET Recent Labs    11/15/23 1457 11/15/23 1501 11/16/23 0850  NA 138 138 137  K 2.9* 4.7 3.4*  CL 103 108 109  CO2  --  16* 19*  BUN 19 19 10   CREATININE 0.80 0.91 0.77  GLUCOSE 335* 92 87      Mary [name]  Trauma Response RN  Please call TRN at [phone] for further assistance.

## 2023-11-16 NOTE — Plan of Care (Signed)
  Problem: Education: Goal: Knowledge of General Education information will improve Description: Including pain rating scale, medication(s)/side effects and non-pharmacologic comfort measures 11/16/2023 0344 by Marvis Kenneth SAILOR, RN Outcome: Progressing 11/16/2023 0007 by Marvis Kenneth SAILOR, RN Outcome: Progressing   Problem: Health Behavior/Discharge Planning: Goal: Ability to manage health-related needs will improve 11/16/2023 0344 by Marvis Kenneth SAILOR, RN Outcome: Progressing 11/16/2023 0007 by Marvis Kenneth SAILOR, RN Outcome: Progressing   Problem: Clinical Measurements: Goal: Ability to maintain clinical measurements within normal limits will improve 11/16/2023 0344 by Marvis Kenneth SAILOR, RN Outcome: Progressing 11/16/2023 0007 by Marvis Kenneth SAILOR, RN Outcome: Progressing Goal: Will remain free from infection 11/16/2023 0344 by Marvis Kenneth SAILOR, RN Outcome: Progressing 11/16/2023 0007 by Marvis Kenneth SAILOR, RN Outcome: Progressing Goal: Diagnostic test results will improve 11/16/2023 0344 by Marvis Kenneth SAILOR, RN Outcome: Progressing 11/16/2023 0007 by Marvis Kenneth SAILOR, RN Outcome: Progressing Goal: Respiratory complications will improve 11/16/2023 0344 by Marvis Kenneth SAILOR, RN Outcome: Progressing 11/16/2023 0007 by Marvis Kenneth SAILOR, RN Outcome: Progressing Goal: Cardiovascular complication will be avoided 11/16/2023 0344 by Marvis Kenneth SAILOR, RN Outcome: Progressing 11/16/2023 0007 by Marvis Kenneth SAILOR, RN Outcome: Progressing   Problem: Activity: Goal: Risk for activity intolerance will decrease 11/16/2023 0344 by Marvis Kenneth SAILOR, RN Outcome: Progressing 11/16/2023 0007 by Marvis Kenneth SAILOR, RN Outcome: Progressing   Problem: Nutrition: Goal: Adequate nutrition will be maintained 11/16/2023 0344 by Marvis Kenneth SAILOR, RN Outcome: Progressing 11/16/2023 0007 by Marvis Kenneth SAILOR, RN Outcome: Progressing   Problem: Coping: Goal: Level of anxiety will  decrease 11/16/2023 0344 by Marvis Kenneth SAILOR, RN Outcome: Progressing 11/16/2023 0007 by Marvis Kenneth SAILOR, RN Outcome: Progressing   Problem: Elimination: Goal: Will not experience complications related to bowel motility 11/16/2023 0344 by Marvis Kenneth SAILOR, RN Outcome: Progressing 11/16/2023 0007 by Marvis Kenneth SAILOR, RN Outcome: Progressing Goal: Will not experience complications related to urinary retention 11/16/2023 0344 by Marvis Kenneth SAILOR, RN Outcome: Progressing 11/16/2023 0007 by Marvis Kenneth SAILOR, RN Outcome: Progressing   Problem: Pain Managment: Goal: General experience of comfort will improve and/or be controlled 11/16/2023 0344 by Marvis Kenneth SAILOR, RN Outcome: Progressing 11/16/2023 0007 by Marvis Kenneth SAILOR, RN Outcome: Progressing   Problem: Safety: Goal: Ability to remain free from injury will improve 11/16/2023 0344 by Marvis Kenneth SAILOR, RN Outcome: Progressing 11/16/2023 0007 by Marvis Kenneth SAILOR, RN Outcome: Progressing   Problem: Skin Integrity: Goal: Risk for impaired skin integrity will decrease 11/16/2023 0344 by Marvis Kenneth SAILOR, RN Outcome: Progressing 11/16/2023 0007 by Marvis Kenneth SAILOR, RN Outcome: Progressing

## 2023-11-16 NOTE — Op Note (Signed)
 PATIENT NAME: Mary [name]   MEDICAL RECORD NO.:   [id number]    DATE OF BIRTH: 06/22/1990   DATE OF PROCEDURE: 11/15/2023                               OPERATIVE REPORT     PREOPERATIVE DIAGNOSES: 1. Open right ankle fracture/dislocation   POSTOPERATIVE DIAGNOSES: 1. Open right ankle fracture/dislocation   PROCEDURES:    Irrigation and debridement of right ankle fracture, including debridement of soft tissue and bone Placement of external fixator, spanning the right ankle joint Reduction of right ankle fracture/dislocation   SURGEON:  Oneil Priestly, MD.   ASSISTANTBETHA Ileana Clara, PA-C.   ANESTHESIA:  General endotracheal anesthesia.   COMPLICATIONS:  None.   DISPOSITION:  Stable.   ESTIMATED BLOOD LOSS:  Minimal.   INDICATIONS FOR SURGERY:  Briefly, Mary [name] is a 33 year old female, for whom I was consulted on due to a severe right ankle injury.  She was noted to have an open right ankle fracture/dislocation. This was readily apparent on her radiographs and physical examination.  Of note, history obtained was minimal, and she was intubated at the time of my evaluation with her, with no family or friends at her bedside.  Given the emergent nature of her situation, she was brought to surgery for an irrigation and debridement of her open right ankle fracture, with placement of an external fixator.    OPERATIVE DETAILS:  On 11/15/2023, the patient was brought to surgery. The patient was intubated upon arrival to the operating room.  Her right lower extremity was prepped and draped in usual sterile fashion.  A timeout procedure was performed. An open lateral wound was readily apparent. At this point, using pulsatile irrigation, I did very liberally irrigate the superficial and deep aspects of the wound.  I did ensure that the irrigation did aggressively extend across the ankle joint.  Superficial and deep debris was noted, and was removed.  There were free bone fragments,  including small pieces of articular cartilage, that were debrided as well.  At this point, the wound was closed using 2-0 nylon via a horizontal mattress technique.   At this point, we proceeded with placement of the external fixator.  2 pins were placed in the proximal tibia, after making small stab incisions.  The near and far cortices were purchased.  At this point, a small incision was made at the medial aspect of the right calcaneus, and a pin was advanced medially to laterally through the calcaneus.  A stab incision was made laterally.  There is no undue tension on the stab incisions. At this point, the connectors were secured onto the pins, as were the external fixator bars.  One was placed medially and one was placed laterally.  I turned my attention toward reduction of the ankle fracture dislocation.  And eversion maneuver was performed, and distraction was applied across the ankle joint.  Using fluoroscopy, I did confirm appropriate reduction of the ankle joint.  I was very pleased with the reduction and the anatomic alignment. The bars were then firmly secured to the pins using a locking wrench.  I was very pleased with the final fluoroscopic images, which did confirm appropriate positioning of the pins and anatomic alignment of the ankle mortise.  Xeroform and Kerlix and an Ace bandage was applied. The patient was then transferred to a hospital bed and exited the room under the care  of the anesthesia team uneventfully.   Of note, Ileana Clara was my assistant throughout surgery, and did aid in retraction, suctioning, the debridement, placement of the fixator,and closure from start to finish.     Oneil Priestly, MD
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
## 2023-11-17 LAB — HEPATITIS PANEL, ACUTE
HCV Ab: REACTIVE — AB
Hep A IgM: NONREACTIVE
Hep B C IgM: NONREACTIVE
Hepatitis B Surface Ag: NONREACTIVE

## 2023-11-17 LAB — CBC
HCT: 33.4 % — ABNORMAL LOW (ref 36.0–46.0)
Hemoglobin: 10.5 g/dL — ABNORMAL LOW (ref 12.0–15.0)
MCH: 26.5 pg (ref 26.0–34.0)
MCHC: 31.4 g/dL (ref 30.0–36.0)
MCV: 84.3 fL (ref 80.0–100.0)
Platelets: 294 10*3/uL (ref 150–400)
RBC: 3.96 MIL/uL (ref 3.87–5.11)
RDW: 17.3 % — ABNORMAL HIGH (ref 11.5–15.5)
WBC: 11.5 10*3/uL — ABNORMAL HIGH (ref 4.0–10.5)
nRBC: 0 % (ref 0.0–0.2)

## 2023-11-17 LAB — BASIC METABOLIC PANEL WITH GFR
Anion gap: 10 (ref 5–15)
BUN: 8 mg/dL (ref 6–20)
CO2: 22 mmol/L (ref 22–32)
Calcium: 8.4 mg/dL — ABNORMAL LOW (ref 8.9–10.3)
Chloride: 106 mmol/L (ref 98–111)
Creatinine, Ser: 0.74 mg/dL (ref 0.44–1.00)
GFR, Estimated: >60 mL/min (ref >60)
Glucose, Bld: 90 mg/dL (ref 70–99)
Potassium: 3.4 mmol/L — ABNORMAL LOW (ref 3.5–5.1)
Sodium: 138 mmol/L (ref 135–145)

## 2023-11-17 MED ORDER — ENSURE PLUS HIGH PROTEIN PO LIQD
237.0000 mL | Freq: Two times a day (BID) | ORAL | Status: DC
  Administered 2023-11-18 – 2023-11-20 (×4): 237 mL via ORAL

## 2023-11-17 MED ORDER — POTASSIUM CHLORIDE CRYS ER 20 MEQ PO TBCR
40.0000 meq | EXTENDED_RELEASE_TABLET | Freq: Once | ORAL | Status: AC
  Administered 2023-11-17: 40 meq via ORAL
  Filled 2023-11-17: qty 2

## 2023-11-17 MED ORDER — ENOXAPARIN SODIUM 30 MG/0.3ML IJ SOSY: 30.0000 mg | PREFILLED_SYRINGE | Freq: Two times a day (BID) | INTRAMUSCULAR | Status: DC
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
## 2023-11-17 NOTE — Plan of Care (Signed)
  Problem: Education: Goal: Knowledge of General Education information will improve Description: Including pain rating scale, medication(s)/side effects and non-pharmacologic comfort measures 11/17/2023 0614 by Marvis Kenneth SAILOR, RN Outcome: Progressing 11/16/2023 2025 by Marvis Kenneth SAILOR, RN Outcome: Progressing   Problem: Health Behavior/Discharge Planning: Goal: Ability to manage health-related needs will improve 11/17/2023 0614 by Marvis Kenneth SAILOR, RN Outcome: Progressing 11/16/2023 2025 by Marvis Kenneth SAILOR, RN Outcome: Progressing   Problem: Clinical Measurements: Goal: Ability to maintain clinical measurements within normal limits will improve 11/17/2023 0614 by Marvis Kenneth SAILOR, RN Outcome: Progressing 11/16/2023 2025 by Marvis Kenneth SAILOR, RN Outcome: Progressing Goal: Will remain free from infection 11/17/2023 0614 by Marvis Kenneth SAILOR, RN Outcome: Progressing 11/16/2023 2025 by Marvis Kenneth SAILOR, RN Outcome: Progressing Goal: Diagnostic test results will improve 11/17/2023 0614 by Marvis Kenneth SAILOR, RN Outcome: Progressing 11/16/2023 2025 by Marvis Kenneth SAILOR, RN Outcome: Progressing Goal: Respiratory complications will improve 11/17/2023 0614 by Marvis Kenneth SAILOR, RN Outcome: Progressing 11/16/2023 2025 by Marvis Kenneth SAILOR, RN Outcome: Progressing Goal: Cardiovascular complication will be avoided 11/17/2023 9385 by Marvis Kenneth SAILOR, RN Outcome: Progressing 11/16/2023 2025 by Marvis Kenneth SAILOR, RN Outcome: Progressing   Problem: Activity: Goal: Risk for activity intolerance will decrease 11/17/2023 0614 by Marvis Kenneth SAILOR, RN Outcome: Progressing 11/16/2023 2025 by Marvis Kenneth SAILOR, RN Outcome: Progressing   Problem: Nutrition: Goal: Adequate nutrition will be maintained 11/17/2023 0614 by Marvis Kenneth SAILOR, RN Outcome: Progressing 11/16/2023 2025 by Marvis Kenneth SAILOR, RN Outcome: Progressing   Problem: Coping: Goal: Level of anxiety will  decrease 11/17/2023 0614 by Marvis Kenneth SAILOR, RN Outcome: Progressing 11/16/2023 2025 by Marvis Kenneth SAILOR, RN Outcome: Progressing   Problem: Elimination: Goal: Will not experience complications related to bowel motility 11/17/2023 0614 by Marvis Kenneth SAILOR, RN Outcome: Progressing 11/16/2023 2025 by Marvis Kenneth SAILOR, RN Outcome: Progressing Goal: Will not experience complications related to urinary retention 11/17/2023 0614 by Marvis Kenneth SAILOR, RN Outcome: Progressing 11/16/2023 2025 by Marvis Kenneth SAILOR, RN Outcome: Progressing   Problem: Pain Managment: Goal: General experience of comfort will improve and/or be controlled 11/17/2023 0614 by Marvis Kenneth SAILOR, RN Outcome: Progressing 11/16/2023 2025 by Marvis Kenneth SAILOR, RN Outcome: Progressing   Problem: Safety: Goal: Ability to remain free from injury will improve 11/17/2023 0614 by Marvis Kenneth SAILOR, RN Outcome: Progressing 11/16/2023 2025 by Marvis Kenneth SAILOR, RN Outcome: Progressing   Problem: Skin Integrity: Goal: Risk for impaired skin integrity will decrease 11/17/2023 0614 by Marvis Kenneth SAILOR, RN Outcome: Progressing 11/16/2023 2025 by Marvis Kenneth SAILOR, RN Outcome: Progressing

## 2023-11-17 NOTE — Progress Notes (Signed)
 Transition of Care [facility]) - CAGE-AID Screening   Patient Details  Name: Mary [name] MRN: [id number] Date of Birth: 01/11/1991  Darice CHRISTELLA Rouleau, RN Trauma Response Nurse Phone Number: [phone] 11/17/2023, 4:44 PM   Clinical Narrative:  Pt has a history of fentanyl  and opiod use/abuse. Drug screen was positive on 6/17 for amphetamines, benzos, cocaine, and marijuana. Had expressed interest in treatment on 6/17.  CAGE-AID Screening:    Have You Ever Felt You Ought to Cut Down on Your Drinking or Drug Use?: Yes Have People Annoyed You By Critizing Your Drinking Or Drug Use?: Yes Have You Felt Bad Or Guilty About Your Drinking Or Drug Use?: Yes Have You Ever Had a Drink or Used Drugs First Thing In The Morning to Steady Your Nerves or to Get Rid of a Hangover?: Yes CAGE-AID Score: 4  Substance Abuse Education Offered: (S) Yes (will need information on discharge)

## 2023-11-17 NOTE — Progress Notes (Signed)
   11/17/23 1330  Urine Measurement/Characteristics  Urinary Incontinence No  Urine Color Yellow/straw  Urine Appearance Clear  Urinary Interventions Bladder scan;Intermittent/Straight cath  Bladder Scan Volume (mL) 556 mL  Intermittent/Straight Cath (mL) 975 mL  Intermittent Catheter Size 16  Hygiene Peri care

## 2023-11-17 NOTE — Progress Notes (Signed)
 Pt unable to void since the removal of  the foley.Bladder scan performed, and a total of  550 ml of urine scanned. In and out cath performed and a total of 950 ml of urine drained.

## 2023-11-17 NOTE — Progress Notes (Signed)
 Pt voided x 1 incontinence. Bladder scan PVR 22 ml. P. Amo Rainey Rodger RN

## 2023-11-18 LAB — SURGICAL PCR SCREEN
MRSA, PCR: NEGATIVE
Staphylococcus aureus: NEGATIVE

## 2023-11-18 LAB — PREGNANCY, URINE: Preg Test, Ur: NEGATIVE

## 2023-11-18 LAB — CBC
HCT: 34.5 % — ABNORMAL LOW (ref 36.0–46.0)
Hemoglobin: 11.1 g/dL — ABNORMAL LOW (ref 12.0–15.0)
MCH: 26.7 pg (ref 26.0–34.0)
MCHC: 32.2 g/dL (ref 30.0–36.0)
MCV: 83.1 fL (ref 80.0–100.0)
Platelets: 325 10*3/uL (ref 150–400)
RBC: 4.15 MIL/uL (ref 3.87–5.11)
RDW: 17.3 % — ABNORMAL HIGH (ref 11.5–15.5)
WBC: 12.3 10*3/uL — ABNORMAL HIGH (ref 4.0–10.5)
nRBC: 0 % (ref 0.0–0.2)

## 2023-11-18 LAB — BASIC METABOLIC PANEL WITH GFR
Anion gap: 11 (ref 5–15)
BUN: 11 mg/dL (ref 6–20)
CO2: 20 mmol/L — ABNORMAL LOW (ref 22–32)
Calcium: 8.6 mg/dL — ABNORMAL LOW (ref 8.9–10.3)
Chloride: 105 mmol/L (ref 98–111)
Creatinine, Ser: 0.58 mg/dL (ref 0.44–1.00)
GFR, Estimated: >60 mL/min
Glucose, Bld: 123 mg/dL — ABNORMAL HIGH (ref 70–99)
Potassium: 3.9 mmol/L (ref 3.5–5.1)
Sodium: 136 mmol/L (ref 135–145)

## 2023-11-18 MED ORDER — HYDROXYZINE HCL 25 MG PO TABS
25.0000 mg | ORAL_TABLET | Freq: Four times a day (QID) | ORAL | Status: DC | PRN
Start: 2023-11-18 — End: 2023-11-22
  Administered 2023-11-18 (×2): 25 mg via ORAL
  Filled 2023-11-18 (×2): qty 1

## 2023-11-18 MED ORDER — DICYCLOMINE HCL 20 MG PO TABS
20.0000 mg | ORAL_TABLET | Freq: Four times a day (QID) | ORAL | Status: DC | PRN
Start: 2023-11-18 — End: 2023-11-22

## 2023-11-18 MED ORDER — CLONIDINE HCL 0.1 MG PO TABS
0.1000 mg | ORAL_TABLET | Freq: Four times a day (QID) | ORAL | Status: AC
  Administered 2023-11-18 – 2023-11-20 (×4): 0.1 mg via ORAL
  Filled 2023-11-18 (×4): qty 1

## 2023-11-18 MED ORDER — POLYETHYLENE GLYCOL 3350 17 G PO PACK
17.0000 g | PACK | Freq: Two times a day (BID) | ORAL | Status: DC
  Administered 2023-11-18 – 2023-11-21 (×6): 17 g via ORAL
  Filled 2023-11-18 (×6): qty 1

## 2023-11-18 MED ORDER — CLONIDINE HCL 0.1 MG PO TABS: 0.1000 mg | ORAL_TABLET | Freq: Every day | ORAL | Status: DC

## 2023-11-18 MED ORDER — ENOXAPARIN SODIUM 30 MG/0.3ML IJ SOSY
30.0000 mg | PREFILLED_SYRINGE | Freq: Once | INTRAMUSCULAR | Status: AC
  Administered 2023-11-18: 30 mg via SUBCUTANEOUS
  Filled 2023-11-18: qty 0.3

## 2023-11-18 MED ORDER — DOCUSATE SODIUM 100 MG PO CAPS
100.0000 mg | ORAL_CAPSULE | Freq: Two times a day (BID) | ORAL | Status: DC
  Administered 2023-11-18 – 2023-11-21 (×5): 100 mg via ORAL
  Filled 2023-11-18 (×6): qty 1

## 2023-11-18 MED ORDER — CLONIDINE HCL 0.1 MG PO TABS
0.1000 mg | ORAL_TABLET | ORAL | Status: AC
  Administered 2023-11-20 – 2023-11-22 (×4): 0.1 mg via ORAL
  Filled 2023-11-18 (×4): qty 1

## 2023-11-18 MED ORDER — ENOXAPARIN SODIUM 30 MG/0.3ML IJ SOSY
30.0000 mg | PREFILLED_SYRINGE | Freq: Two times a day (BID) | INTRAMUSCULAR | Status: DC
  Administered 2023-11-20 – 2023-11-22 (×5): 30 mg via SUBCUTANEOUS
  Filled 2023-11-18 (×5): qty 0.3
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
## 2023-11-19 ENCOUNTER — Encounter (HOSPITAL_COMMUNITY): Admission: EM | Disposition: A | Discharge status: Home / Self Care | Payer: Self-pay | Source: Home / Self Care

## 2023-11-19 ENCOUNTER — Other Ambulatory Visit: Payer: Self-pay

## 2023-11-19 ENCOUNTER — Inpatient Hospital Stay (HOSPITAL_COMMUNITY): Payer: MEDICAID

## 2023-11-19 ENCOUNTER — Encounter (HOSPITAL_COMMUNITY): Payer: Self-pay

## 2023-11-19 ENCOUNTER — Inpatient Hospital Stay (HOSPITAL_COMMUNITY): Payer: MEDICAID | Admitting: Registered Nurse

## 2023-11-19 DIAGNOSIS — S82891B Other fracture of right lower leg, initial encounter for open fracture type I or II: Secondary | ICD-10-CM

## 2023-11-19 HISTORY — PX: EXTERNAL FIXATION REMOVAL: SHX5040

## 2023-11-19 HISTORY — PX: IRRIGATION AND DEBRIDEMENT POSTERIOR HIP: SHX7265

## 2023-11-19 HISTORY — PX: ORIF ANKLE FRACTURE: SHX5408

## 2023-11-19 LAB — HCV RNA QUANT
HCV Quantitative Log: 5.9 {Log_IU}/mL (ref >1.70)
HCV Quantitative: 795000 [IU]/mL (ref >50)

## 2023-11-19 LAB — TRIGLYCERIDES: Triglycerides: 94 mg/dL (ref <150)

## 2023-11-19 LAB — CBC
HCT: 36.9 % (ref 36.0–46.0)
Hemoglobin: 11.7 g/dL — ABNORMAL LOW (ref 12.0–15.0)
MCH: 26.7 pg (ref 26.0–34.0)
MCHC: 31.7 g/dL (ref 30.0–36.0)
MCV: 84.2 fL (ref 80.0–100.0)
Platelets: 338 10*3/uL (ref 150–400)
RBC: 4.38 MIL/uL (ref 3.87–5.11)
RDW: 17.7 % — ABNORMAL HIGH (ref 11.5–15.5)
WBC: 11.3 10*3/uL — ABNORMAL HIGH (ref 4.0–10.5)
nRBC: 0 % (ref 0.0–0.2)

## 2023-11-19 LAB — BASIC METABOLIC PANEL WITH GFR
Anion gap: 12 (ref 5–15)
BUN: 17 mg/dL (ref 6–20)
CO2: 21 mmol/L — ABNORMAL LOW (ref 22–32)
Calcium: 8.8 mg/dL — ABNORMAL LOW (ref 8.9–10.3)
Chloride: 102 mmol/L (ref 98–111)
Creatinine, Ser: 0.61 mg/dL (ref 0.44–1.00)
GFR, Estimated: >60 mL/min (ref >60)
Glucose, Bld: 105 mg/dL — ABNORMAL HIGH (ref 70–99)
Potassium: 4.2 mmol/L (ref 3.5–5.1)
Sodium: 135 mmol/L (ref 135–145)

## 2023-11-19 LAB — GLUCOSE, CAPILLARY: Glucose-Capillary: 230 mg/dL — ABNORMAL HIGH (ref 70–99)

## 2023-11-19 SURGERY — OPEN REDUCTION INTERNAL FIXATION (ORIF) ANKLE FRACTURE
Anesthesia: General | Site: Ankle | Laterality: Right

## 2023-11-19 MED ORDER — KETAMINE HCL 10 MG/ML IJ SOLN
INTRAMUSCULAR | Status: DC | PRN
Start: 2023-11-19 — End: 2023-11-19
  Administered 2023-11-19: 30 mg via INTRAVENOUS
  Administered 2023-11-19 (×2): 10 mg via INTRAVENOUS

## 2023-11-19 MED ORDER — HYDROMORPHONE HCL 1 MG/ML IJ SOLN
INTRAMUSCULAR | Status: AC
Start: 2023-11-19 — End: 2023-11-19
  Filled 2023-11-19: qty 0.5

## 2023-11-19 MED ORDER — EPHEDRINE 5 MG/ML INJ
INTRAVENOUS | Status: AC
Start: 2023-11-19 — End: 2023-11-19
  Filled 2023-11-19: qty 5

## 2023-11-19 MED ORDER — VASHE WOUND IRRIGATION OPTIME
TOPICAL | Status: DC | PRN
Start: 2023-11-19 — End: 2023-11-19
  Administered 2023-11-19: 34 [oz_av]

## 2023-11-19 MED ORDER — PROPOFOL 10 MG/ML IV BOLUS
INTRAVENOUS | Status: AC
Start: 2023-11-19 — End: 2023-11-19
  Filled 2023-11-19: qty 20

## 2023-11-19 MED ORDER — MIDAZOLAM HCL 2 MG/2ML IJ SOLN
INTRAMUSCULAR | Status: AC
Start: 2023-11-19 — End: 2023-11-19
  Filled 2023-11-19: qty 2

## 2023-11-19 MED ORDER — DEXMEDETOMIDINE HCL IN NACL 80 MCG/20ML IV SOLN
INTRAVENOUS | Status: DC | PRN
Start: 2023-11-19 — End: 2023-11-19
  Administered 2023-11-19: 8 ug via INTRAVENOUS
  Administered 2023-11-19: 4 ug via INTRAVENOUS
  Administered 2023-11-19: 8 ug via INTRAVENOUS

## 2023-11-19 MED ORDER — SUGAMMADEX SODIUM 200 MG/2ML IV SOLN
INTRAVENOUS | Status: DC | PRN
  Administered 2023-11-19: 200 mg via INTRAVENOUS

## 2023-11-19 MED ORDER — CEFAZOLIN SODIUM 1 G IJ SOLR
INTRAMUSCULAR | Status: AC
  Filled 2023-11-19: qty 20

## 2023-11-19 MED ORDER — NALOXONE HCL 0.4 MG/ML IJ SOLN: 0.4000 mg | INTRAMUSCULAR | Status: DC | PRN

## 2023-11-19 MED ORDER — PHENYLEPHRINE HCL-NACL 20-0.9 MG/250ML-% IV SOLN
INTRAVENOUS | Status: DC | PRN
  Administered 2023-11-19: 50 ug/min via INTRAVENOUS

## 2023-11-19 MED ORDER — 0.9 % SODIUM CHLORIDE (POUR BTL) OPTIME
TOPICAL | Status: DC | PRN
  Administered 2023-11-19: 1000 mL

## 2023-11-19 MED ORDER — ROCURONIUM BROMIDE 10 MG/ML (PF) SYRINGE
PREFILLED_SYRINGE | INTRAVENOUS | Status: DC | PRN
  Administered 2023-11-19: 50 mg via INTRAVENOUS
  Administered 2023-11-19 (×2): 20 mg via INTRAVENOUS
  Administered 2023-11-19: 10 mg via INTRAVENOUS

## 2023-11-19 MED ORDER — MIDAZOLAM HCL 2 MG/2ML IJ SOLN
INTRAMUSCULAR | Status: AC
  Administered 2023-11-19: 1 mg via INTRAVENOUS
  Filled 2023-11-19: qty 2

## 2023-11-19 MED ORDER — ROPIVACAINE HCL 5 MG/ML IJ SOLN
INTRAMUSCULAR | Status: DC | PRN
  Administered 2023-11-19: 15 mL via PERINEURAL
  Administered 2023-11-19: 25 mL via PERINEURAL

## 2023-11-19 MED ORDER — DEXAMETHASONE SODIUM PHOSPHATE 10 MG/ML IJ SOLN
INTRAMUSCULAR | Status: AC
  Filled 2023-11-19: qty 1

## 2023-11-19 MED ORDER — LIDOCAINE 2% (20 MG/ML) 5 ML SYRINGE
INTRAMUSCULAR | Status: DC | PRN
  Administered 2023-11-19: 60 mg via INTRAVENOUS

## 2023-11-19 MED ORDER — METHOCARBAMOL 500 MG PO TABS
1000.0000 mg | ORAL_TABLET | Freq: Three times a day (TID) | ORAL | Status: DC
  Administered 2023-11-19 – 2023-11-20 (×2): 1000 mg via ORAL
  Filled 2023-11-19 (×2): qty 2

## 2023-11-19 MED ORDER — MIDAZOLAM HCL 2 MG/2ML IJ SOLN: 1.0000 mg | Freq: Once | INTRAMUSCULAR | Status: AC

## 2023-11-19 MED ORDER — PROPOFOL 10 MG/ML IV BOLUS
INTRAVENOUS | Status: AC
  Filled 2023-11-19: qty 20

## 2023-11-19 MED ORDER — LIDOCAINE 5 % EX PTCH
1.0000 | MEDICATED_PATCH | CUTANEOUS | Status: DC
  Filled 2023-11-19: qty 1

## 2023-11-19 MED ORDER — FENTANYL CITRATE (PF) 100 MCG/2ML IJ SOLN: 50.0000 ug | Freq: Once | INTRAMUSCULAR | Status: AC

## 2023-11-19 MED ORDER — DEXMEDETOMIDINE HCL IN NACL 80 MCG/20ML IV SOLN
INTRAVENOUS | Status: AC
  Filled 2023-11-19: qty 20

## 2023-11-19 MED ORDER — ONDANSETRON HCL 4 MG/2ML IJ SOLN: 4.0000 mg | Freq: Once | INTRAMUSCULAR | Status: DC | PRN

## 2023-11-19 MED ORDER — ORAL CARE MOUTH RINSE: 15.0000 mL | Freq: Once | OROMUCOSAL | Status: AC

## 2023-11-19 MED ORDER — AMISULPRIDE (ANTIEMETIC) 5 MG/2ML IV SOLN: 10.0000 mg | Freq: Once | INTRAVENOUS | Status: DC | PRN

## 2023-11-19 MED ORDER — HYDROMORPHONE HCL 1 MG/ML IJ SOLN
INTRAMUSCULAR | Status: DC | PRN
  Administered 2023-11-19: .5 mg via INTRAVENOUS

## 2023-11-19 MED ORDER — ACETAMINOPHEN 10 MG/ML IV SOLN
INTRAVENOUS | Status: DC | PRN
  Administered 2023-11-19: 1000 mg via INTRAVENOUS

## 2023-11-19 MED ORDER — PHENYLEPHRINE 80 MCG/ML (10ML) SYRINGE FOR IV PUSH (FOR BLOOD PRESSURE SUPPORT)
PREFILLED_SYRINGE | INTRAVENOUS | Status: DC | PRN
  Administered 2023-11-19: 80 ug via INTRAVENOUS
  Administered 2023-11-19: 240 ug via INTRAVENOUS
  Administered 2023-11-19: 160 ug via INTRAVENOUS

## 2023-11-19 MED ORDER — PROPOFOL 10 MG/ML IV BOLUS
INTRAVENOUS | Status: DC | PRN
  Administered 2023-11-19: 120 mg via INTRAVENOUS

## 2023-11-19 MED ORDER — KETAMINE HCL 50 MG/5ML IJ SOSY
PREFILLED_SYRINGE | INTRAMUSCULAR | Status: AC
  Filled 2023-11-19: qty 5

## 2023-11-19 MED ORDER — FENTANYL CITRATE (PF) 100 MCG/2ML IJ SOLN
INTRAMUSCULAR | Status: AC
  Administered 2023-11-19: 50 ug via INTRAVENOUS
  Filled 2023-11-19: qty 2

## 2023-11-19 MED ORDER — CEFAZOLIN SODIUM-DEXTROSE 2-3 GM-%(50ML) IV SOLR
INTRAVENOUS | Status: DC | PRN
  Administered 2023-11-19: 2 g via INTRAVENOUS

## 2023-11-19 MED ORDER — FENTANYL CITRATE (PF) 250 MCG/5ML IJ SOLN
INTRAMUSCULAR | Status: AC
  Filled 2023-11-19: qty 5

## 2023-11-19 MED ORDER — METHOCARBAMOL 1000 MG/10ML IJ SOLN: 1000.0000 mg | Freq: Three times a day (TID) | INTRAMUSCULAR | Status: DC

## 2023-11-19 MED ORDER — ONDANSETRON HCL 4 MG/2ML IJ SOLN
INTRAMUSCULAR | Status: DC | PRN
  Administered 2023-11-19: 4 mg via INTRAVENOUS

## 2023-11-19 MED ORDER — FENTANYL CITRATE (PF) 250 MCG/5ML IJ SOLN
INTRAMUSCULAR | Status: DC | PRN
  Administered 2023-11-19 (×3): 50 ug via INTRAVENOUS

## 2023-11-19 MED ORDER — ROCURONIUM BROMIDE 10 MG/ML (PF) SYRINGE
PREFILLED_SYRINGE | INTRAVENOUS | Status: AC
  Filled 2023-11-19: qty 10

## 2023-11-19 MED ORDER — LIDOCAINE 2% (20 MG/ML) 5 ML SYRINGE
INTRAMUSCULAR | Status: AC
  Filled 2023-11-19: qty 5

## 2023-11-19 MED ORDER — OXYCODONE HCL 5 MG PO TABS
10.0000 mg | ORAL_TABLET | ORAL | Status: DC | PRN
  Administered 2023-11-20 – 2023-11-22 (×9): 15 mg via ORAL
  Filled 2023-11-19 (×9): qty 3

## 2023-11-19 MED ORDER — CHLORHEXIDINE GLUCONATE 0.12 % MT SOLN: 15.0000 mL | Freq: Once | OROMUCOSAL | Status: AC

## 2023-11-19 MED ORDER — ACETAMINOPHEN 10 MG/ML IV SOLN
INTRAVENOUS | Status: AC
  Filled 2023-11-19: qty 100

## 2023-11-19 MED ORDER — CLONIDINE HCL (ANALGESIA) 100 MCG/ML EP SOLN
EPIDURAL | Status: DC | PRN
  Administered 2023-11-19 (×2): 50 ug

## 2023-11-19 MED ORDER — GABAPENTIN 300 MG PO CAPS
300.0000 mg | ORAL_CAPSULE | Freq: Three times a day (TID) | ORAL | Status: DC
  Administered 2023-11-19 – 2023-11-20 (×2): 300 mg via ORAL
  Filled 2023-11-19 (×2): qty 1

## 2023-11-19 MED ORDER — DEXAMETHASONE SODIUM PHOSPHATE 10 MG/ML IJ SOLN
INTRAMUSCULAR | Status: DC | PRN
  Administered 2023-11-19: 10 mg via INTRAVENOUS

## 2023-11-19 MED ORDER — LACTATED RINGERS IV SOLN: INTRAVENOUS | Status: DC

## 2023-11-19 MED ORDER — CHLORHEXIDINE GLUCONATE 0.12 % MT SOLN
OROMUCOSAL | Status: AC
  Administered 2023-11-19: 15 mL via OROMUCOSAL
  Filled 2023-11-19: qty 15

## 2023-11-19 MED ORDER — ONDANSETRON HCL 4 MG/2ML IJ SOLN
INTRAMUSCULAR | Status: AC
  Filled 2023-11-19: qty 2

## 2023-11-19 MED ORDER — FENTANYL CITRATE (PF) 100 MCG/2ML IJ SOLN: 25.0000 ug | INTRAMUSCULAR | Status: DC | PRN

## 2023-11-19 SURGICAL SUPPLY — 75 items
BAG COUNTER SPONGE SURGICOUNT (BAG) ×3 IMPLANT
BANDAGE ESMARK 6X9 LF (GAUZE/BANDAGES/DRESSINGS) ×3 IMPLANT
BIT DRILL SHORT 2.0 ZI (BIT) IMPLANT
BIT DRILL SHORT 2.5 (BIT) IMPLANT
BNDG ELASTIC 4INX 5YD STR LF (GAUZE/BANDAGES/DRESSINGS) IMPLANT
BNDG ELASTIC 4X5.8 VLCR STR LF (GAUZE/BANDAGES/DRESSINGS) IMPLANT
BNDG ELASTIC 6INX 5YD STR LF (GAUZE/BANDAGES/DRESSINGS) IMPLANT
BNDG GAUZE DERMACEA FLUFF 4 (GAUZE/BANDAGES/DRESSINGS) ×6 IMPLANT
BRUSH SCRUB EZ PLAIN DRY (MISCELLANEOUS) ×6 IMPLANT
CANISTER WOUND CARE 500ML ATS (WOUND CARE) IMPLANT
CLEANSER WND VASHE INSTL 34OZ (WOUND CARE) IMPLANT
COVER MAYO STAND STRL (DRAPES) ×3 IMPLANT
COVER SURGICAL LIGHT HANDLE (MISCELLANEOUS) ×3 IMPLANT
CUFF TRNQT CYL 34X4.125X (TOURNIQUET CUFF) ×3 IMPLANT
DRAPE C-ARM 42X72 X-RAY (DRAPES) ×3 IMPLANT
DRAPE C-ARMOR (DRAPES) ×3 IMPLANT
DRAPE HALF SHEET 40X57 (DRAPES) ×3 IMPLANT
DRAPE U-SHAPE 47X51 STRL (DRAPES) ×3 IMPLANT
DRESSING MEPILEX FLEX 4X4 (GAUZE/BANDAGES/DRESSINGS) IMPLANT
DRESSING VERAFLO CLEANS CC MED (GAUZE/BANDAGES/DRESSINGS) IMPLANT
DRIVER RETENTION T15 LONG (ORTHOPEDIC DISPOSABLE SUPPLIES) IMPLANT
DRSG ADAPTIC 3X8 NADH LF (GAUZE/BANDAGES/DRESSINGS) IMPLANT
DRSG CUTIMED SORBACT 7X9 (GAUZE/BANDAGES/DRESSINGS) IMPLANT
DRSG EMULSION OIL 3X3 NADH (GAUZE/BANDAGES/DRESSINGS) IMPLANT
DRSG MEPILEX POST OP 4X8 (GAUZE/BANDAGES/DRESSINGS) IMPLANT
DRSG MEPITEL 4X7.2 (GAUZE/BANDAGES/DRESSINGS) IMPLANT
ELECTRODE REM PT RTRN 9FT ADLT (ELECTROSURGICAL) ×3 IMPLANT
GAUZE SPONGE 4X4 12PLY STRL (GAUZE/BANDAGES/DRESSINGS) ×6 IMPLANT
GLOVE BIO SURGEON STRL SZ7.5 (GLOVE) ×3 IMPLANT
GLOVE BIO SURGEON STRL SZ8 (GLOVE) ×3 IMPLANT
GLOVE BIOGEL PI IND STRL 7.5 (GLOVE) ×3 IMPLANT
GLOVE BIOGEL PI IND STRL 8 (GLOVE) ×3 IMPLANT
GLOVE SURG ORTHO LTX SZ7.5 (GLOVE) ×6 IMPLANT
GOWN STRL REUS W/ TWL LRG LVL3 (GOWN DISPOSABLE) ×6 IMPLANT
GOWN STRL REUS W/ TWL XL LVL3 (GOWN DISPOSABLE) ×3 IMPLANT
GRAFT MYRIAD 7X10 (Graft) IMPLANT
KIT BASIN OR (CUSTOM PROCEDURE TRAY) ×3 IMPLANT
KIT TURNOVER KIT B (KITS) ×3 IMPLANT
KWIRE ALPS MXV 1.6X6 ZI (WIRE) IMPLANT
MANIFOLD NEPTUNE II (INSTRUMENTS) ×3 IMPLANT
NDL HYPO 21X1.5 SAFETY (NEEDLE) IMPLANT
NEEDLE HYPO 21X1.5 SAFETY (NEEDLE) IMPLANT
NS IRRIG 1000ML POUR BTL (IV SOLUTION) ×3 IMPLANT
PACK GENERAL/GYN (CUSTOM PROCEDURE TRAY) ×3 IMPLANT
PACK ORTHO EXTREMITY (CUSTOM PROCEDURE TRAY) ×3 IMPLANT
PAD ARMBOARD POSITIONER FOAM (MISCELLANEOUS) ×6 IMPLANT
PAD CAST 4YDX4 CTTN HI CHSV (CAST SUPPLIES) IMPLANT
PADDING CAST COTTON 6X4 STRL (CAST SUPPLIES) IMPLANT
PLATE FIB HOOK 6H (Plate) IMPLANT
PLATE MED MALL HOOK 3H (Plate) IMPLANT
POWDER MYRIAD MORCLLS FINE 500 (Miscellaneous) IMPLANT
PREVENA RESTOR AXIOFORM 29X28 (GAUZE/BANDAGES/DRESSINGS) IMPLANT
SCREW LOCK MDS 2.710 (Screw) IMPLANT
SCREW LOCK MDS 2.7X12 (Screw) IMPLANT
SCREW LOCK MDS 2.7X14 (Screw) IMPLANT
SCREW NL 2.7X12 (Screw) IMPLANT
SCREW NL3.5X38 (Screw) IMPLANT
SCREW NLOCK 2.7X10 (Screw) IMPLANT
SCREW NLOCK 3.5X26 (Screw) IMPLANT
SCREW NLOCK 3.5X30 (Screw) IMPLANT
SCREW NLOCK 3.5X34 (Screw) IMPLANT
SPLINT PLASTER CAST FAST 5X30 (CAST SUPPLIES) IMPLANT
SPONGE T-LAP 18X18 ~~LOC~~+RFID (SPONGE) ×3 IMPLANT
STAPLER SKIN PROX 35W (STAPLE) IMPLANT
SUCTION TUBE FRAZIER 10FR DISP (SUCTIONS) ×3 IMPLANT
SURGILUBE 2OZ TUBE FLIPTOP (MISCELLANEOUS) IMPLANT
SUT ETHILON 2 0 FS 18 (SUTURE) ×6 IMPLANT
SUT PDS AB 2-0 CT1 27 (SUTURE) IMPLANT
SUT VIC AB 2-0 CT1 TAPERPNT 27 (SUTURE) ×6 IMPLANT
TOWEL GREEN STERILE (TOWEL DISPOSABLE) ×6 IMPLANT
TOWEL GREEN STERILE FF (TOWEL DISPOSABLE) ×3 IMPLANT
TUBE CONNECTING 12X1/4 (SUCTIONS) ×3 IMPLANT
UNDERPAD 30X36 HEAVY ABSORB (UNDERPADS AND DIAPERS) ×3 IMPLANT
WATER STERILE IRR 1000ML POUR (IV SOLUTION) ×3 IMPLANT
YANKAUER SUCT BULB TIP NO VENT (SUCTIONS) IMPLANT

## 2023-11-19 NOTE — Transfer of Care (Signed)
 Immediate Anesthesia Transfer of Care Note  Patient: Mary [name]  Procedure(s) Performed: OPEN REDUCTION INTERNAL FIXATION (ORIF) ANKLE FRACTURE (Right: Ankle) REMOVAL, EXTERNAL FIXATION DEVICE, LOWER EXTREMITY (Right) IRRIGATION AND DEBRIDEMENT, OPEN FRACTURE (Right)  Patient Location: PACU  Anesthesia Type:General  Level of Consciousness: awake and alert   Airway & Oxygen Therapy: Patient Spontanous Breathing and Patient connected to face mask oxygen  Post-op Assessment: Report given to RN and Post -op Vital signs reviewed and stable  Post vital signs: Reviewed and stable  Last Vitals:  Vitals Value Taken Time  BP 101/59 11/19/23 14:11  Temp    Pulse 68 11/19/23 14:14  Resp 15 11/19/23 14:14  SpO2 97 % 11/19/23 14:14  Vitals shown include unfiled device data.  Last Pain:  Vitals:   11/19/23 0921  TempSrc:   PainSc: 10-Worst pain ever      Patients Stated Pain Goal: 2 (11/18/23 2000)  Complications: No notable events documented.

## 2023-11-19 NOTE — Plan of Care (Signed)
  Problem: Education: Goal: Knowledge of General Education information will improve Description: Including pain rating scale, medication(s)/side effects and non-pharmacologic comfort measures 11/19/2023 0031 by Marvis Kenneth SAILOR, RN Outcome: Progressing 11/18/2023 2059 by Marvis Kenneth SAILOR, RN Outcome: Progressing   Problem: Health Behavior/Discharge Planning: Goal: Ability to manage health-related needs will improve 11/19/2023 0031 by Marvis Kenneth SAILOR, RN Outcome: Progressing 11/18/2023 2059 by Marvis Kenneth SAILOR, RN Outcome: Progressing   Problem: Clinical Measurements: Goal: Ability to maintain clinical measurements within normal limits will improve 11/19/2023 0031 by Marvis Kenneth SAILOR, RN Outcome: Progressing 11/18/2023 2059 by Marvis Kenneth SAILOR, RN Outcome: Progressing Goal: Will remain free from infection 11/19/2023 0031 by Marvis Kenneth SAILOR, RN Outcome: Progressing 11/18/2023 2059 by Marvis Kenneth SAILOR, RN Outcome: Progressing Goal: Diagnostic test results will improve 11/19/2023 0031 by Marvis Kenneth SAILOR, RN Outcome: Progressing 11/18/2023 2059 by Marvis Kenneth SAILOR, RN Outcome: Progressing Goal: Respiratory complications will improve 11/19/2023 0031 by Marvis Kenneth SAILOR, RN Outcome: Progressing 11/18/2023 2059 by Marvis Kenneth SAILOR, RN Outcome: Progressing Goal: Cardiovascular complication will be avoided 11/19/2023 0031 by Marvis Kenneth SAILOR, RN Outcome: Progressing 11/18/2023 2059 by Marvis Kenneth SAILOR, RN Outcome: Progressing   Problem: Activity: Goal: Risk for activity intolerance will decrease 11/19/2023 0031 by Marvis Kenneth SAILOR, RN Outcome: Progressing 11/18/2023 2059 by Marvis Kenneth SAILOR, RN Outcome: Progressing   Problem: Nutrition: Goal: Adequate nutrition will be maintained 11/19/2023 0031 by Marvis Kenneth SAILOR, RN Outcome: Progressing 11/18/2023 2059 by Marvis Kenneth SAILOR, RN Outcome: Progressing   Problem: Coping: Goal: Level of anxiety will  decrease 11/19/2023 0031 by Marvis Kenneth SAILOR, RN Outcome: Progressing 11/18/2023 2059 by Marvis Kenneth SAILOR, RN Outcome: Progressing   Problem: Elimination: Goal: Will not experience complications related to bowel motility 11/19/2023 0031 by Marvis Kenneth SAILOR, RN Outcome: Progressing 11/18/2023 2059 by Marvis Kenneth SAILOR, RN Outcome: Progressing Goal: Will not experience complications related to urinary retention 11/19/2023 0031 by Marvis Kenneth SAILOR, RN Outcome: Progressing 11/18/2023 2059 by Marvis Kenneth SAILOR, RN Outcome: Progressing   Problem: Pain Managment: Goal: General experience of comfort will improve and/or be controlled 11/19/2023 0031 by Marvis Kenneth SAILOR, RN Outcome: Progressing 11/18/2023 2059 by Marvis Kenneth SAILOR, RN Outcome: Progressing   Problem: Safety: Goal: Ability to remain free from injury will improve 11/19/2023 0031 by Marvis Kenneth SAILOR, RN Outcome: Progressing 11/18/2023 2059 by Marvis Kenneth SAILOR, RN Outcome: Progressing   Problem: Skin Integrity: Goal: Risk for impaired skin integrity will decrease 11/19/2023 0031 by Marvis Kenneth SAILOR, RN Outcome: Progressing 11/18/2023 2059 by Marvis Kenneth SAILOR, RN Outcome: Progressing

## 2023-11-19 NOTE — Anesthesia Procedure Notes (Signed)
 Anesthesia Regional Block: Popliteal block   Pre-Anesthetic Checklist: , timeout performed,  Correct Patient, Correct Site, Correct Laterality,  Correct Procedure, Correct Position, site marked,  Risks and benefits discussed,  Surgical consent,  Pre-op evaluation,  At surgeon's request and post-op pain management  Laterality: Right  Prep: chloraprep       Needles:  Injection technique: Single-shot  Needle Type: Echogenic Needle     Needle Length: 9cm  Needle Gauge: 21     Additional Needles:   Procedures:,,,, ultrasound used (permanent image in chart),,    Narrative:  Start time: 11/19/2023 10:22 AM End time: 11/19/2023 10:27 AM Injection made incrementally with aspirations every 5 mL.  Performed by: Personally  Anesthesiologist: Epifanio Charleston, MD

## 2023-11-19 NOTE — Significant Event (Signed)
 Rapid Response Event Note   Reason for Call :  Lethargy.  Per staff, pt was very agitated earlier and wanted to leave AM. Pt eventually calmed enough and agreed to stay.  Pt had visitors at shift change.  When RN went to assess pt, she was difficult to arouse.   Initial Focused Assessment:  Pt sitting in wheelchair bedside bed. Her eyes are closed. She is in no visible distress. With repeated noxious stimuli, pt will squeeze B hand and wiggle B toes. Pt goes back to sleep immediately with no stimulation. Pupils 5, equal, reactive.   T-98.2, HR-84, 118/71, RR-14, SpO2-100% on RA.   Situation discussed with Trauma RN and decision made to give pt narcan . Prior to giving med, pt informed of plan. Pt opened eyes and said why do you want me to wake up, I'm awake? Pt was then able, though sleepy, to answer questions, follow commands, and assist staff in getting back in the bed.  Pt says that she doesn't want to be awake. Pt denied taking/being given anything that could make her sleepy. She states she is pain free at this time.  Trauma RN to bedside to assess pt-exam consistent with second assessment above.   Interventions:  CBG-230 Plan of Care:  Pt sleepy but more awake/participatory after explaining plan to give narcan . Continue to monitor pt closely. Please call RRT if further assistance needed.   Event Summary:   MD Notified: TRN notified and came to bedside.  Call Time:2021 Arrival Time:2025 End Time:2100  Tish Graeme Piety, RN

## 2023-11-19 NOTE — Anesthesia Procedure Notes (Signed)
 Anesthesia Regional Block: Adductor canal block   Pre-Anesthetic Checklist: , timeout performed,  Correct Patient, Correct Site, Correct Laterality,  Correct Procedure, Correct Position, site marked,  Risks and benefits discussed,  Surgical consent,  Pre-op evaluation,  At surgeon's request and post-op pain management  Laterality: Right  Prep: chloraprep       Needles:  Injection technique: Single-shot  Needle Type: Echogenic Needle     Needle Length: 9cm  Needle Gauge: 21     Additional Needles:   Procedures:,,,, ultrasound used (permanent image in chart),,    Narrative:  Start time: 11/19/2023 10:27 AM End time: 11/19/2023 10:32 AM Injection made incrementally with aspirations every 5 mL.  Performed by: Personally  Anesthesiologist: Epifanio Charleston, MD

## 2023-11-19 NOTE — Progress Notes (Signed)
 Pt off the unit to the OR

## 2023-11-19 NOTE — Anesthesia Procedure Notes (Signed)
 Procedure Name: Intubation Date/Time: 11/19/2023 11:24 AM  Performed by: Boyce Shilling, CRNAPre-anesthesia Checklist: Patient identified, Emergency Drugs available, Suction available, Timeout performed and Patient being monitored Patient Re-evaluated:Patient Re-evaluated prior to induction Oxygen Delivery Method: Circle system utilized Preoxygenation: Pre-oxygenation with 100% oxygen Induction Type: IV induction Ventilation: Mask ventilation without difficulty Laryngoscope Size: Mac and 3 Grade View: Grade I Tube type: Oral Tube size: 7.0 mm Number of attempts: 1 Airway Equipment and Method: Stylet Placement Confirmation: ETT inserted through vocal cords under direct vision, positive ETCO2, CO2 detector and breath sounds checked- equal and bilateral Secured at: 22 cm Tube secured with: Tape Dental Injury: Teeth and Oropharynx as per pre-operative assessment

## 2023-11-19 NOTE — Progress Notes (Signed)
 No changes overnight.  The risks and benefits of surgical repair of the right open ankle fracture were discussed with the patient and her mother, including the possibility of infection, nerve injury, vessel injury, wound breakdown, arthritis, symptomatic hardware, DVT/ PE, loss of motion, malunion, nonunion, and need for further surgery among others.  We also specifically discussed the possible need to stage surgery because of the elevated risk of soft tissue breakdown that could lead to amputation.  These risks were acknowledged and consent was provided to proceed.  Ozell Bruch, MD [facility], [facility] [phone]

## 2023-11-19 NOTE — Anesthesia Postprocedure Evaluation (Signed)
 Anesthesia Post Note  Patient: Mary [name]  Procedure(s) Performed: OPEN REDUCTION INTERNAL FIXATION (ORIF) ANKLE FRACTURE (Right: Ankle) REMOVAL, EXTERNAL FIXATION DEVICE, LOWER EXTREMITY (Right) IRRIGATION AND DEBRIDEMENT, OPEN FRACTURE (Right)     Patient location during evaluation: PACU Anesthesia Type: General Level of consciousness: awake and alert Pain management: pain level controlled Vital Signs Assessment: post-procedure vital signs reviewed and stable Respiratory status: spontaneous breathing, nonlabored ventilation, respiratory function stable and patient connected to nasal cannula oxygen Cardiovascular status: blood pressure returned to baseline and stable Postop Assessment: no apparent nausea or vomiting Anesthetic complications: no   No notable events documented.  Last Vitals:    Last Pain:                 Mary [name]
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
## 2023-11-19 NOTE — Anesthesia Preprocedure Evaluation (Signed)
 Anesthesia Evaluation  Patient identified by MRN, date of birth, ID band Patient awake    Reviewed: Allergy & Precautions, NPO status , Patient's Chart, lab work & pertinent test results  Airway Mallampati: II  TM Distance: >3 FB Neck ROM: Full    Dental  (+) Teeth Intact, Dental Advisory Given   Pulmonary Current Smoker and Patient abstained from smoking.   Pulmonary exam normal breath sounds clear to auscultation       Cardiovascular negative cardio ROS Normal cardiovascular exam Rhythm:Regular Rate:Normal     Neuro/Psych negative neurological ROS  negative psych ROS   GI/Hepatic negative GI ROS,,,(+) Hepatitis -, C  Endo/Other  negative endocrine ROS    Renal/GU negative Renal ROS     Musculoskeletal open right ankle fracture dislocaiton   Abdominal   Peds  Hematology  (+) Blood dyscrasia, anemia   Anesthesia Other Findings Day of surgery medications reviewed with the patient.  Reproductive/Obstetrics                              Anesthesia Physical Anesthesia Plan  ASA: 3  Anesthesia Plan: General   Post-op Pain Management: Tylenol  PO (pre-op)* and Toradol  IV (intra-op)*   Induction: Intravenous  PONV Risk Score and Plan: 2 and Midazolam , Dexamethasone  and Ondansetron   Airway Management Planned: Oral ETT  Additional Equipment:   Intra-op Plan:   Post-operative Plan: Extubation in OR  Informed Consent: I have reviewed the patients History and Physical, chart, labs and discussed the procedure including the risks, benefits and alternatives for the proposed anesthesia with the patient or authorized representative who has indicated his/her understanding and acceptance.     Dental advisory given  Plan Discussed with: CRNA  Anesthesia Plan Comments:          Anesthesia Quick Evaluation

## 2023-11-19 NOTE — Plan of Care (Signed)
  Problem: Education: Goal: Knowledge of General Education information will improve Description: Including pain rating scale, medication(s)/side effects and non-pharmacologic comfort measures 11/19/2023 1613 by Meliton Cree, RN Outcome: Progressing 11/19/2023 1613 by Meliton Cree, RN Outcome: Progressing   Problem: Health Behavior/Discharge Planning: Goal: Ability to manage health-related needs will improve 11/19/2023 1613 by Meliton Cree, RN Outcome: Progressing 11/19/2023 1613 by Meliton Cree, RN Outcome: Progressing   Problem: Clinical Measurements: Goal: Ability to maintain clinical measurements within normal limits will improve 11/19/2023 1613 by Meliton Cree, RN Outcome: Progressing 11/19/2023 1613 by Meliton Cree, RN Outcome: Progressing Goal: Will remain free from infection 11/19/2023 1613 by Meliton Cree, RN Outcome: Progressing 11/19/2023 1613 by Meliton Cree, RN Outcome: Progressing Goal: Diagnostic test results will improve 11/19/2023 1613 by Meliton Cree, RN Outcome: Progressing 11/19/2023 1613 by Meliton Cree, RN Outcome: Progressing Goal: Respiratory complications will improve 11/19/2023 1613 by Meliton Cree, RN Outcome: Progressing 11/19/2023 1613 by Meliton Cree, RN Outcome: Progressing Goal: Cardiovascular complication will be avoided 11/19/2023 1613 by Meliton Cree, RN Outcome: Progressing 11/19/2023 1613 by Meliton Cree, RN Outcome: Progressing   Problem: Activity: Goal: Risk for activity intolerance will decrease 11/19/2023 1613 by Meliton Cree, RN Outcome: Progressing 11/19/2023 1613 by Meliton Cree, RN Outcome: Progressing   Problem: Nutrition: Goal: Adequate nutrition will be maintained 11/19/2023 1613 by Meliton Cree, RN Outcome: Progressing 11/19/2023 1613 by Meliton Cree, RN Outcome: Progressing   Problem: Coping: Goal: Level of anxiety will decrease 11/19/2023  1613 by Meliton Cree, RN Outcome: Progressing 11/19/2023 1613 by Meliton Cree, RN Outcome: Progressing   Problem: Elimination: Goal: Will not experience complications related to bowel motility 11/19/2023 1613 by Meliton Cree, RN Outcome: Progressing 11/19/2023 1613 by Meliton Cree, RN Outcome: Progressing Goal: Will not experience complications related to urinary retention 11/19/2023 1613 by Meliton Cree, RN Outcome: Progressing 11/19/2023 1613 by Meliton Cree, RN Outcome: Progressing   Problem: Pain Managment: Goal: General experience of comfort will improve and/or be controlled 11/19/2023 1613 by Meliton Cree, RN Outcome: Progressing 11/19/2023 1613 by Meliton Cree, RN Outcome: Progressing   Problem: Safety: Goal: Ability to remain free from injury will improve Outcome: Progressing   Problem: Skin Integrity: Goal: Risk for impaired skin integrity will decrease Outcome: Progressing

## 2023-11-19 NOTE — Progress Notes (Addendum)
 Pt had behavioral episode. Once pt awakened on 5 [place] she was initially drowsy and cooperative. Upon fully awakening pt became verbal, agitated, and refused to follow safety commands. Pt attempt to leave AMA, demanding she be allowed to go outside to smoke a cigarette, although she didn't have any, pt planned to walk around outside to find someone to giver her one. Pt beared weight to RLE for long period of time using walker while attempting to leave AMA. Pt educated on weight bearing restriction but refused to follow RN instruction.  Episode lasted for over 1 hour with security, assigned RN and Charge RN attempting to deescalate and have pt return to room. Pt eventually agreed to go back to room. Pt refused tele monitoring. Primary team on Amion unpageable. Francis Mt, PA made aware of event. Staff informed that pt isnt allowed outside to smoke cigarettes, no nicotine  patch/gum due to concern for delayed wound healing per MD.    Pt educated on potential risk of leaving AMA: delayed wound healing, infection, loss of limb. Pt agreed to understanding risk.   Iguana present in room and being held by pt (brought in by friend). Visitor educated on hospital pet restriction and agreed to not bring Iguana back to bedside when visiting pt.

## 2023-11-20 ENCOUNTER — Inpatient Hospital Stay (HOSPITAL_COMMUNITY): Payer: MEDICAID

## 2023-11-20 ENCOUNTER — Encounter (HOSPITAL_COMMUNITY): Payer: Self-pay | Admitting: Orthopedic Surgery

## 2023-11-20 LAB — BASIC METABOLIC PANEL WITH GFR
Anion gap: 9 (ref 5–15)
BUN: 17 mg/dL (ref 6–20)
CO2: 22 mmol/L (ref 22–32)
Calcium: 8.6 mg/dL — ABNORMAL LOW (ref 8.9–10.3)
Chloride: 103 mmol/L (ref 98–111)
Creatinine, Ser: 0.81 mg/dL (ref 0.44–1.00)
GFR, Estimated: >60 mL/min (ref >60)
Glucose, Bld: 119 mg/dL — ABNORMAL HIGH (ref 70–99)
Potassium: 4.1 mmol/L (ref 3.5–5.1)
Sodium: 134 mmol/L — ABNORMAL LOW (ref 135–145)

## 2023-11-20 LAB — CBC
HCT: 35.6 % — ABNORMAL LOW (ref 36.0–46.0)
Hemoglobin: 11.4 g/dL — ABNORMAL LOW (ref 12.0–15.0)
MCH: 26.7 pg (ref 26.0–34.0)
MCHC: 32 g/dL (ref 30.0–36.0)
MCV: 83.4 fL (ref 80.0–100.0)
Platelets: 396 10*3/uL (ref 150–400)
RBC: 4.27 MIL/uL (ref 3.87–5.11)
RDW: 18 % — ABNORMAL HIGH (ref 11.5–15.5)
WBC: 18.9 10*3/uL — ABNORMAL HIGH (ref 4.0–10.5)
nRBC: 0 % (ref 0.0–0.2)

## 2023-11-20 MED ORDER — GABAPENTIN 300 MG PO CAPS
600.0000 mg | ORAL_CAPSULE | Freq: Three times a day (TID) | ORAL | Status: DC
  Administered 2023-11-20 – 2023-11-22 (×7): 600 mg via ORAL
  Filled 2023-11-20 (×7): qty 2

## 2023-11-20 MED ORDER — MAGNESIUM CITRATE PO SOLN
1.0000 | Freq: Once | ORAL | Status: AC
  Administered 2023-11-20: 1 via ORAL
  Filled 2023-11-20: qty 296

## 2023-11-20 MED ORDER — HYDROMORPHONE HCL 1 MG/ML IJ SOLN
1.0000 mg | INTRAMUSCULAR | Status: DC | PRN
  Administered 2023-11-20 – 2023-11-21 (×3): 1 mg via INTRAVENOUS
  Filled 2023-11-20 (×4): qty 1

## 2023-11-20 MED ORDER — LIDOCAINE 5 % EX PTCH
3.0000 | MEDICATED_PATCH | CUTANEOUS | Status: DC
  Administered 2023-11-21 – 2023-11-22 (×2): 3 via TRANSDERMAL
  Filled 2023-11-20 (×3): qty 3

## 2023-11-20 MED ORDER — METHOCARBAMOL 500 MG PO TABS
1000.0000 mg | ORAL_TABLET | Freq: Four times a day (QID) | ORAL | Status: DC
  Administered 2023-11-20 – 2023-11-22 (×8): 1000 mg via ORAL
  Filled 2023-11-20 (×9): qty 2

## 2023-11-20 MED ORDER — MAGNESIUM HYDROXIDE 400 MG/5ML PO SUSP
30.0000 mL | Freq: Once | ORAL | Status: DC
  Filled 2023-11-20: qty 30
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
## 2023-11-20 NOTE — Plan of Care (Signed)
 Patient still requiring heavy pain control today.  Does ambulate on non weight bearing leg.  Did have BM today with mag citrate. Mentation remained good over course of day.
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
## 2023-11-20 NOTE — Progress Notes (Signed)
 Pt has been non-compliant with surgical precautions to RLE. Pt keeps putting weight on it despite doctor's orders to not put weight on it. This nurse and charge nurse has explained to pt that she is not supposed to be putting weight on foot d/t surgery and to avoid causing complications to the site. The patient nodded that she knows that. Pt then said that her foot is hurting, however pt has already had pain medicine. Pt is resting in the bed at this time. RN will continue to monitor pt.    Bari HERO Laelle Bridgett

## 2023-11-20 NOTE — Progress Notes (Signed)
 Patient is seen walking on the hallway with a walker. Pt was asked if she knew that she can not put weight on her right leg. She nodded and just continued walking through. Pt assisted back to her room. Pt was then reminded that she can not put weight on her right leg for 6-8 weeks to avoid complications to the surgical site. Pt once again just nodded to this nurse.
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
## 2023-11-21 LAB — BASIC METABOLIC PANEL WITH GFR
Anion gap: 10 (ref 5–15)
BUN: 20 mg/dL (ref 6–20)
CO2: 24 mmol/L (ref 22–32)
Calcium: 9.6 mg/dL (ref 8.9–10.3)
Chloride: 105 mmol/L (ref 98–111)
Creatinine, Ser: 0.75 mg/dL (ref 0.44–1.00)
GFR, Estimated: >60 mL/min (ref >60)
Glucose, Bld: 129 mg/dL — ABNORMAL HIGH (ref 70–99)
Potassium: 4.2 mmol/L (ref 3.5–5.1)
Sodium: 139 mmol/L (ref 135–145)

## 2023-11-21 LAB — CBC
HCT: 36.5 % (ref 36.0–46.0)
Hemoglobin: 11.5 g/dL — ABNORMAL LOW (ref 12.0–15.0)
MCH: 26.8 pg (ref 26.0–34.0)
MCHC: 31.5 g/dL (ref 30.0–36.0)
MCV: 85.1 fL (ref 80.0–100.0)
Platelets: 376 10*3/uL (ref 150–400)
RBC: 4.29 MIL/uL (ref 3.87–5.11)
RDW: 17.6 % — ABNORMAL HIGH (ref 11.5–15.5)
WBC: 11.2 10*3/uL — ABNORMAL HIGH (ref 4.0–10.5)
nRBC: 0 % (ref 0.0–0.2)

## 2023-11-21 MED ORDER — IBUPROFEN 200 MG PO TABS
600.0000 mg | ORAL_TABLET | Freq: Three times a day (TID) | ORAL | Status: DC
  Administered 2023-11-21 – 2023-11-22 (×4): 600 mg via ORAL
  Filled 2023-11-21 (×4): qty 3

## 2023-11-21 MED ORDER — HYDROMORPHONE HCL 1 MG/ML IJ SOLN
0.5000 mg | Freq: Four times a day (QID) | INTRAMUSCULAR | Status: DC | PRN
  Administered 2023-11-21 – 2023-11-22 (×3): 0.5 mg via INTRAVENOUS
  Filled 2023-11-21 (×3): qty 0.5
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
## 2023-11-21 NOTE — Discharge Instructions (Addendum)
 [employer] assistance programs Crisis assistance programs  -[facility] Arts development officer. If you are experiencing homelessness in [place], [place] , your first point of contact should be Pensions consultant. You can reach Coordinated Entry by calling (336) [phone] or by emailing [email or web address].  Community access points: [employer] [id number], [place]) every Tuesday from 9am-10am. [facility] ([address] [place]) every Wednesday from 8am-9am.   -[place] Coordinated Re-entry Daniel Mcalpine: Dial 211 and request. Offers referrals to homeless shelters in the area.    -The [employer] 815-291-8905) offers several services to local families, as funding allows. The Emergency Assistance Program (EAP), which they administer, provides household goods, free food, clothing, and financial aid to people in need in the [place]  area. The EAP program does have some qualification, and counselors will interview clients for financial assistance by written referral only. Referrals need to be made by the Department of Social Services or by other EAP approved human services agencies or charities in the area.  -[facility] of [employer], which can be reached at [phone], offers emergency assistance programs for those in need of help, such as food, rent assistance, a soup kitchen, shelter, and clothing. They are based in [facility]  but provide a number of services to those that qualify for assistance.   [facility] Department of Social Services may be able to offer temporary financial assistance and cash grants for paying rent and utilities, Help may be provided for local county residents who may be experiencing personal crisis when other resources, including government programs, are not available. Call [phone]  -High [employer] is a [employer] agency, The organization can offer emergency assistance for paying rent, [employer] Rx, utilities, food, household products and furniture. They offer extensive emergency and transitional housing for families, children and single women, and also run a Boy's and [employer]. Thrift Shops, Secondary school teacher, and other aid offered too. [address] [place], [place]  [address], [phone]  -[place] Low Income Energy Assistance Program -- This is offered for [facility] families. The federal government created [employer] Program provides a one-time cash grant payment to help eligible low-income families pay their electric and heating bills. [address] [place], [place]  [address], [phone]  -[place] Emergency Assistance -- A program offers emergency utility and rent funds for greater [employer] area residents. The program can also provide counseling and referrals to charities and government programs. Also provides food and a free meal program that serves lunch Mondays - Saturdays and dinner seven days per week to individuals in the community. [address], [place], [place]  [address], [phone]  -[employer] - Offers affordable apartment and housing communities across      [place] and [place]. The low income and seniors can access public housing, rental assistance to qualified applicants, and apply for the section 8 rent subsidy program. Other programs include Chiropractor and Engineer, maintenance. [address], [place], [place]  [address], dial [phone].  -[facility] provides transitional housing to veterans and the disabled. Clients will also access other services too, including assistance in applying for Disability, life skills classes, case management, and assistance in finding permanent housing. [address], [place], [place]  [place] [address], call [phone]  -[place] [place] Transitional Housing through [facility] is for people who were just  evicted or that are formerly homeless. The non-profit will also help then gain self-sufficiency, find a home or apartment to live in, and also provides information on rent assistance when needed. Phone [phone]  -The [place] [facility] [facility] helps low income, elderly, or disabled residents in seven counties in the [place] ([place], [place], [place], [place], [place], [place], [place], and [place]) save energy and reduce their utility bills by improving energy efficiency. Phone [phone].  -[employer] is located in the [place] [facility] in the [employer], [address], [address], [place], [place] [address]. Parking is in the rear of the building. Phone: [phone]   General Email: info@gsohc .org  [facility] provides free housing counseling assistance in locating affordable rental housing or housing with support services for families and individuals in crisis and the chronically homeless. We provide potential resources for other housing needs like utilities. Our trained counselors also work with clients on budgeting and financial literacy in effort to empower them to take control of their financial situations. [employer] collaborates with homeless service providers and other stakeholders as part of the [employer] COC (Continuum of Care). The (COC) is a regional/local planning body that coordinates housing and services funding for homeless families and individuals. The role of [facility] in the COC is through housing counseling to work with people we serve on diversion strategies for those that are at imminent risk of becoming homeless. We also work with the Coordinated Assessment/Entry Specialist who attempts to find temporary solutions and/or connects the people  to Housing First, Rapid Re-housing or transitional housing programs. Our Homelessness Prevention Housing Counselors meet with clients on business days (Monday-Fridays, except scheduled holidays) from 8:30 am to 4:30 pm.  Legal assistance for evictions, foreclosure, and more -If you need free legal advice on civil issues, such as foreclosures, evictions, Electronics engineer, government programs, domestic issues and more, Landscape architect of [place]  [facility]) is a Associate Professor firm that provides free legal services and counsel to lower income people, seniors, disabled, and others, The goal is to ensure everyone has access to justice and fair representation. Call them at [phone].  [facility] for [facility] and Community Studies can provide info about obtaining legal assistance with evictions. Phone [phone].  Data processing manager  The [employer], [employer]. offers job and Dispensing optician. Resources are focused on helping students obtain the skills and experiences that are necessary to compete in today's challenging and tight job market. The non-profit faith-based community action agency offers internship trainings as well as classroom instruction. Classes are tailored to meet the needs of people in the [facility] region. [place], [place] [address], [phone]  Foreclosure prevention/Debt Services Family Services of the [employer] Credit Counseling Service inludes debt and foreclosure prevention programs for local families. This includes money management, financial advice, budget review and development of a written action plan with a Pensions consultant to help solve specific individual financial problems. In addition, housing and mortgage counselors can also provide pre- and post-purchase homeownership counseling, default resolution counseling (to prevent foreclosure) and reverse mortgage counseling. A Debt Management Program allows  people and families with a high level of credit card or medical debt to consolidate and repay consumer debt and loans to creditors and rebuild positive credit ratings and scores. Contact [phone] [phone].  Community clinics in [place] -Health Department [facility]: [address] [place], [place], [address]. [phone].  -Health Department [place] [facility]: [phone]  [address], [facility], [address]. [phone].  -[facility] offers medical care through a group of doctors, pharmacies and other healthcare related agencies that offer services for low income, uninsured adults in [place]. Also offers adult Dental care and assistance with applying for an [employer]. Call [phone].   [facility]. This center provides low-cost health care to those without health insurance. Services offered include an onsite pharmacy. Phone [phone]. [address], [address], [place].  -Medication Assistance Program serves as a link between pharmaceutical companies and patients to provide low cost or free prescription medications. This service is available for residents who meet certain income restrictions and have no insurance coverage. PLEASE CALL [phone] [place]) OR [phone] ([place])  -One Step Further: Materials engineer, The [employer] Support & Nutrition Program, [employer]. Call [phone]/ [phone].  Food pantry and assistance -[facility]-Food Bank: [address], [place] [address]. Phone [phone]  -[facility] Food Pantry: [address], [place], [place] [address]. [phone].  -Missionary Ministry: has the purpose of visiting the sick and shut-ins and provide for needs in the surrounding communities. Call [phone]. Email: [email or web address] This program provides: Food box for seniors, Financial assistance, Food to meet basic  nutritional needs.  -Meals on Wheels with Senior Resources: [facility] residents age 51 and over who are homebound and unable to obtain and prepare a nutritious meal for themselves are eligible for this service. There may be a waiting list in certain parts of [facility] if the route in that area is full. If you are in [facility] and [place] call [phone] to register. For all other areas call [phone] to register.  -Greater Dietitian: [email or web address]://[email or web address]  TRANSPORTATION: -[employer] Department of Health: Call [facility] and [employer] at [phone] for details. AttractionGuides.es  -Access [place]: Access [place] is the [employer] Agency's shared-ride transportation service for eligible riders who have a disability that prevents them from riding the fixed route bus. Call [phone]. Access [place] riders must pay a fare of $1.50 per trip, or may purchase a 10-ride punch card for $14.00 ($1.40 per ride) or a 40-ride punch card for $48.00 ($1.20 per ride).  -The Shepherd's WHEELS rideshare transportation service is provided for senior citizens (60+) who live independently within [place] city limits and are unable to drive or have limited access to transportation. Call [phone] to schedule an appointment.  -[place] Transportation: For Medicare or Medicaid recipients call [phone]?[facility] Ambulance, wheelchair fleeta, and ambulatory quotes available.   FLEEING VIOLENCE: -Family Services of the [place]- 24/7 Crisis line [phone]) -[facility] Centers: (336) 641-SAFE [phone])[phone])  [place] 2-1-1 is another useful way to locate resources in the community. Visit ShedSizes.ch to find service information online. If you need additional assistance, 2-1-1 Referral Specialists are available 24 hours a day, every day by dialing  2-1-1 or [phone] from any phone. The call is free, confidential, and available in any language.  Affordable Housing Search http://www.nchousingsearch.[facility])   M-F 8a-3p [address]  [place], [place] [address] [phone] Services include: laundry, barbering, support groups, case management, phone & computer access, showers, AA/NA mtgs, mental health/substance abuse nurse, job skills class, disability information, VA assistance, spiritual classes, etc. Winter Shelter available when temperatures are less than 32 degrees.   HOMELESS SHELTERS [facility] at [facility]- Call [phone] ext. [phone]  or ext. [phone]. Located at [address], [place], [place] [address]  [facility] Mens Shelter- Call [phone]. Located at [address], [place] [address].  [facility] [employer]. Call [phone]. Office located at [address] [place] [address].  [facility] Family Housing through [place] [phone].  [facility]- Call [phone]. Located at [address] [place], [place], [place] [address].  [facility]-For Pregnant mothers. Call [phone]. Located at [address] [place], [address].  [place] [facility]For men in [place]. Call [phone]. [facility] in [place]. Call [phone].  Home of [employer] for [employer] [phone]. Office located at [address] [place], [address].  [employer] be agreeable to help with chores. Call [phone] ext. [phone].  Men's: [address] [place], [place] [address]. Women's: [facility]  [address] [place], [place] [address]  Crisis Services [facility] Mobile Crisis Management- [phone]  [facility] [address], [place], [place] [address]. Phone: [phone]      Orthopaedic Trauma  Service Discharge Instructions   General Discharge Instructions  Orthopaedic Injuries:  Open right ankle fracture treated with irrigation debridement and open reduction internal fixation  WEIGHT BEARING STATUS: Nonweightbearing right leg for 6 weeks.  Do not walk on your right leg  RANGE OF MOTION/ACTIVITY: No ankle motion.  Unrestricted range of motion of right knee  Bone health:   Review the following resource for additional information regarding bone health  [email or web address]  Wound Care: Do not remove splint.  Keep splint clean and dry.  Make sure that your Prevena is charged during the day.  Plug it up at night so the battery can charge  DVT/PE prophylaxis: Eliquis 2.5 mg every 12 hours for 30 days for blood clot prevention  Diet: as you were eating previously.  Can use over the counter stool softeners and bowel preparations, such as Miralax , to help with bowel movements.  Narcotics can be constipating.  Be sure to drink plenty of fluids  PAIN MEDICATION USE AND EXPECTATIONS  You have likely been given narcotic medications to help control your pain.  After a traumatic event that results in an fracture (broken bone) with or without surgery, it is ok to use narcotic pain medications to help control one's pain.  We understand that everyone responds to pain differently and each individual patient will be evaluated on a regular basis for the continued need for narcotic medications. Ideally, narcotic medication use should last no more than 6-8 weeks (coinciding with fracture healing).   As a patient it is your responsibility as well to monitor narcotic medication use and report the amount and frequency you use these medications when you come to your office visit.   We would also advise that if you are using narcotic medications, you should take a dose prior to therapy to maximize you participation.  IF YOU ARE ON NARCOTIC MEDICATIONS IT IS NOT PERMISSIBLE TO  OPERATE A MOTOR VEHICLE (MOTORCYCLE/CAR/TRUCK/MOPED) OR HEAVY MACHINERY DO NOT MIX NARCOTICS WITH OTHER CNS (CENTRAL NERVOUS SYSTEM) DEPRESSANTS SUCH AS ALCOHOL   POST-OPERATIVE OPIOID TAPER INSTRUCTIONS: It is important to wean off of your opioid medication as soon as possible. If you do not need pain medication after your surgery it is ok to stop day one. Opioids include: Codeine , Hydrocodone (Norco, Vicodin), Oxycodone (Percocet, oxycontin ) and hydromorphone  amongst others.  Long term and even short term  use of opiods can cause: Increased pain response Dependence Constipation Depression Respiratory depression And more.  Withdrawal symptoms can include Flu like symptoms Nausea, vomiting And more Techniques to manage these symptoms Hydrate well Eat regular healthy meals Stay active Use relaxation techniques(deep breathing, meditating, yoga) Do Not substitute Alcohol to help with tapering If you have been on opioids for less than two weeks and do not have pain than it is ok to stop all together.  Plan to wean off of opioids This plan should start within one week post op of your fracture surgery  Maintain the same interval or time between taking each dose and first decrease the dose.  Cut the total daily intake of opioids by one tablet each day Next start to increase the time between doses. The last dose that should be eliminated is the evening dose.    STOP SMOKING OR USING NICOTINE  PRODUCTS!!!!  As discussed nicotine  severely impairs your body's ability to heal surgical and traumatic wounds but also impairs bone healing.  Wounds and bone heal by forming microscopic blood vessels (angiogenesis) and nicotine  is a vasoconstrictor (essentially, shrinks blood vessels).  Therefore, if vasoconstriction occurs to these microscopic blood vessels they essentially disappear and are unable to deliver necessary nutrients to the healing tissue.  This is one modifiable factor that you can do to  dramatically increase your chances of healing your injury.    (This means no smoking, no nicotine  gum, patches, etc)  DO NOT USE NONSTEROIDAL ANTI-INFLAMMATORY DRUGS (NSAID'S)  Using products such as Advil  (ibuprofen ), Aleve  (naproxen ), Motrin  (ibuprofen ) for additional pain control during fracture healing can delay and/or prevent the healing response.  If you would like to take over the counter (OTC) medication, Tylenol  (acetaminophen ) is ok.  However, some narcotic medications that are given for pain control contain acetaminophen  as well. Therefore, you should not exceed more than 4000 mg of tylenol  in a day if you do not have liver disease.  Also note that there are may OTC medicines, such as cold medicines and allergy medicines that my contain tylenol  as well.  If you have any questions about medications and/or interactions please ask your doctor/PA or your pharmacist.      ICE AND ELEVATE INJURED/OPERATIVE EXTREMITY  Using ice and elevating the injured extremity above your heart can help with swelling and pain control.  Icing in a pulsatile fashion, such as 20 minutes on and 20 minutes off, can be followed.    Do not place ice directly on skin. Make sure there is a barrier between to skin and the ice pack.    Using frozen items such as frozen peas works well as the conform nicely to the are that needs to be iced.  USE AN ACE WRAP OR TED HOSE FOR SWELLING CONTROL  In addition to icing and elevation, Ace wraps or TED hose are used to help limit and resolve swelling.  It is recommended to use Ace wraps or TED hose until you are informed to stop.    When using Ace Wraps start the wrapping distally (farthest away from the body) and wrap proximally (closer to the body)   Example: If you had surgery on your leg and you do not have a splint on, start the ace wrap at the toes and work your way up to the thigh        If you had surgery on your upper extremity and do not have a splint on, start the ace  wrap at  your fingers and work your way up to the upper arm  IF YOU ARE IN A SPLINT OR CAST DO NOT REMOVE IT FOR ANY REASON   If your splint gets wet for any reason please contact the office immediately. You may shower in your splint or cast as long as you keep it dry.  This can be done by wrapping in a cast cover or garbage back (or similar)  Do Not stick any thing down your splint or cast such as pencils, money, or hangers to try and scratch yourself with.  If you feel itchy take benadryl  as prescribed on the bottle for itching  IF YOU ARE IN A CAM BOOT (BLACK BOOT)  You may remove boot periodically. Perform daily dressing changes as noted below.  Wash the liner of the boot regularly and wear a sock when wearing the boot. It is recommended that you sleep in the boot until told otherwise    Call office for the following: Temperature greater than 101F Persistent nausea and vomiting Severe uncontrolled pain Redness, tenderness, or signs of infection (pain, swelling, redness, odor or green/yellow discharge around the site) Difficulty breathing, headache or visual disturbances Hives Persistent dizziness or light-headedness Extreme fatigue Any other questions or concerns you may have after discharge  In an emergency, call 911 or go to an Emergency Department at a nearby hospital  HELPFUL INFORMATION  If you had a block, it will wear off between 8-24 hrs postop typically.  This is period when your pain may go from nearly zero to the pain you would have had postop without the block.  This is an abrupt transition but nothing dangerous is happening.  You may take an extra dose of narcotic when this happens.  You should wean off your narcotic medicines as soon as you are able.  Most patients will be off or using minimal narcotics before their first postop appointment.   We suggest you use the pain medication the first night prior to going to bed, in order to ease any pain when the anesthesia  wears off. You should avoid taking pain medications on an empty stomach as it will make you nauseous.  Do not drink alcoholic beverages or take illicit drugs when taking pain medications.  In most states it is against the law to drive while you are in a splint or sling.  And certainly against the law to drive while taking narcotics.  You may return to work/school in the next couple of days when you feel up to it.   Pain medication may make you constipated.  Below are a few solutions to try in this order: Decrease the amount of pain medication if you aren't having pain. Drink lots of decaffeinated fluids. Drink prune juice and/or each dried prunes  If the first 3 don't work start with additional solutions Take Colace - an over-the-counter stool softener Take Senokot - an over-the-counter laxative Take Miralax  - a stronger over-the-counter laxative     CALL THE OFFICE WITH ANY QUESTIONS OR CONCERNS: [phone]   VISIT OUR WEBSITE FOR ADDITIONAL INFORMATION: [email or web address]
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
## 2023-11-21 NOTE — TOC Progression Note (Signed)
 Transition of Care [facility]) - Progression Note    Patient Details  Name: Mary [name] MRN: [id number] Date of Birth: Jul 09, 1990  [facility]) CM/SW Contact  Rosalva Jon Bloch, RN Phone Number: 11/21/2023, 3:30 PM  Clinical Narrative:    Orders noted for DME. Referral made with [facility] [employer] Equipment will be delivered to bedside prior to discharge.  TOC team will continue following for needs....  Expected Discharge Plan: Homeless Shelter Barriers to Discharge: Continued Medical Work up  Expected Discharge Plan and Services   Discharge Planning Services: CM Consult   Living arrangements for the past 2 months: Homeless Expected Discharge Date: 11/22/23               DME Arranged: 3-N-1, [employer] youth, Community education officer wheelchair with seat cushion DME Agency: [employer] Date DME Agency Contacted: 11/21/23 Time DME Agency Contacted: 1530 Representative spoke with at DME Agency: [place]             Social Determinants of Health (SDOH) Interventions SDOH Screenings   Food Insecurity: Patient Unable To Answer (11/16/2023)  Housing: Unknown (11/16/2023)  Transportation Needs: Patient Unable To Answer (11/16/2023)  Utilities: Patient Unable To Answer (11/16/2023)  Tobacco Use: High Risk (11/19/2023)    Readmission Risk Interventions     No data to display
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
## 2023-11-21 NOTE — TOC Progression Note (Signed)
 Transition of Care [facility]) - Progression Note    Patient Details  Name: Mary [name] MRN: [id number] Date of Birth: 04/28/1991  Transition [facility]) CM/SW Contact  Roux Brandy E Vallory Oetken, LCSW Phone Number: 11/21/2023, 1:53 PM  Clinical Narrative:    CSW met with patient and patient's mother at bedside. Patient's mom states she is working on setting up for patient to stay with either her uncle or cousin at [place].  Patient interested in shelter list and housing resources as a back up plan. Shelter list provided at bedside as well as bus pass. Other housing resources added to AVS. Patient states she will reach out to DSS to see if she has an assigned PCP.  Patient states she has been to OP Rehab at [facility] in the past. Asked RNCM for OP Rehab referral in [place].  Patient states her cousin will provide transport at [place]. Patient is interested in getting a wheelchair and walker, CSW called Davina with [employer]- he states patient's insurance should cover both. Referral made for bedside delivery.   Expected Discharge Plan: Homeless Shelter Barriers to Discharge: Continued Medical Work up  Expected Discharge Plan and Services   Discharge Planning Services: CM Consult   Living arrangements for the past 2 months: Homeless Expected Discharge Date: 11/22/23                                     Social Determinants of Health (SDOH) Interventions SDOH Screenings   Food Insecurity: Patient Unable To Answer (11/16/2023)  Housing: Unknown (11/16/2023)  Transportation Needs: Patient Unable To Answer (11/16/2023)  Utilities: Patient Unable To Answer (11/16/2023)  Tobacco Use: High Risk (11/19/2023)    Readmission Risk Interventions     No data to display

## 2023-11-22 ENCOUNTER — Other Ambulatory Visit (HOSPITAL_COMMUNITY): Payer: Self-pay

## 2023-11-22 ENCOUNTER — Telehealth (HOSPITAL_COMMUNITY): Payer: Self-pay | Admitting: Pharmacy Technician

## 2023-11-22 LAB — TRIGLYCERIDES: Triglycerides: 259 mg/dL — ABNORMAL HIGH (ref <150)

## 2023-11-22 MED ORDER — LIDOCAINE 5 % EX PTCH
3.0000 | MEDICATED_PATCH | CUTANEOUS | 0 refills | Status: AC
  Filled 2023-11-22: qty 30, 10d supply, fill #0

## 2023-11-22 MED ORDER — OXYCODONE HCL 10 MG PO TABS
10.0000 mg | ORAL_TABLET | ORAL | 0 refills | Status: AC | PRN
  Filled 2023-11-22: qty 40, 5d supply, fill #0

## 2023-11-22 MED ORDER — METHOCARBAMOL 500 MG PO TABS
1000.0000 mg | ORAL_TABLET | Freq: Four times a day (QID) | ORAL | 0 refills | Status: AC | PRN
  Filled 2023-11-22: qty 60, 8d supply, fill #0

## 2023-11-22 MED ORDER — DOCUSATE SODIUM 100 MG PO CAPS: 100.0000 mg | ORAL_CAPSULE | Freq: Every day | ORAL | Status: AC | PRN

## 2023-11-22 MED ORDER — ACETAMINOPHEN 500 MG PO TABS: 1000.0000 mg | ORAL_TABLET | Freq: Four times a day (QID) | ORAL | Status: AC | PRN

## 2023-11-22 MED ORDER — FLUCONAZOLE 150 MG PO TABS
150.0000 mg | ORAL_TABLET | Freq: Once | ORAL | 0 refills | Status: AC
  Filled 2023-11-22: qty 2, 4d supply, fill #0

## 2023-11-22 MED ORDER — POLYETHYLENE GLYCOL 3350 17 G PO PACK: 17.0000 g | PACK | Freq: Every day | ORAL | Status: AC | PRN

## 2023-11-22 MED ORDER — IBUPROFEN 600 MG PO TABS
600.0000 mg | ORAL_TABLET | Freq: Three times a day (TID) | ORAL | 0 refills | Status: AC | PRN
  Filled 2023-11-22: qty 30, 10d supply, fill #0

## 2023-11-22 MED ORDER — APIXABAN 2.5 MG PO TABS
2.5000 mg | ORAL_TABLET | Freq: Two times a day (BID) | ORAL | 0 refills | Status: AC
  Filled 2023-11-22: qty 60, 30d supply, fill #0

## 2023-11-22 MED ORDER — GABAPENTIN 300 MG PO CAPS
600.0000 mg | ORAL_CAPSULE | Freq: Three times a day (TID) | ORAL | 0 refills | Status: AC
  Filled 2023-11-22: qty 180, 30d supply, fill #0
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
## 2023-11-22 NOTE — Telephone Encounter (Signed)
 Pharmacy Patient Advocate Encounter   Received notification from Inpatient Request that prior authorization for Lidocaine  5% patches is required/requested.   Insurance verification completed.   The patient is insured through [facility] .   Per test claim: PA required; PA started via CoverMyMeds. KEY [id number] . Waiting for clinical questions to populate.

## 2023-11-22 NOTE — Progress Notes (Signed)
 PA Francis Mt made aware of prevena alarming air leak. Tried another portable wound vac, also showing air leak. No air leak issue when hooked up to hospital wound vac. Per PA, likely still suctioning some, so make sure it is left on and he will see patient for appointment Wednesday 7/2. Patient and patient's mother made aware.

## 2023-11-22 NOTE — TOC Transition Note (Signed)
 Transition of Care [facility]) - Discharge Note   Patient Details  Name: Mary [name] MRN: [id number] Date of Birth: [birth year]  [facility]) CM/SW Contact:  Rosalva Jon Bloch, RN Phone Number: 11/22/2023, 12:44 PM   Clinical Narrative:    Patient will DC to: home with mom Anticipated DC date: 11/22/2023 Family notified:yes Transport by: car  Per MD patient ready for DC today. RN, patient, and patient's  mom notified of DC. Pt now states she will transition home with her mother, mom @ beside and confirmed. Referral made for outpatient therapy and noted on AVS. DME will be to the bedside prior to discharge. Mom to provide transportation to home. Pt without RX med concerns.   RNCM will sign off for now as intervention is no longer needed. Please consult us  again if new needs arise.    Final next level of care: Home/Self Care (outpatient therapy) Barriers to Discharge: No Barriers Identified   Patient Goals and CMS Choice     Choice offered to / list presented to : Patient      Discharge Placement                       Discharge Plan and Services Additional resources added to the After Visit Summary for     Discharge Planning Services: CM Consult            DME Arranged: 3-N-1, Walker youth, Community education officer wheelchair with seat cushion DME Agency: [employer] Date DME Agency Contacted: 11/21/23 Time DME Agency Contacted: 1530 Representative spoke with at DME Agency: [place]            Social Drivers of Health (SDOH) Interventions SDOH Screenings   Food Insecurity: Patient Unable To Answer (11/16/2023)  Housing: Unknown (11/16/2023)  Transportation Needs: Patient Unable To Answer (11/16/2023)  Utilities: Patient Unable To Answer (11/16/2023)  Tobacco Use: High Risk (11/19/2023)     Readmission Risk Interventions     No data to display

## 2023-11-22 NOTE — Progress Notes (Signed)
 Orthopedic Tech Progress Note Patient Details:  JARETZY LHOMMEDIEU 22-Feb-1991 [id number]  Ortho Devices Type of Ortho Device: CAM walker Ortho Device/Splint Location: RLE/Pt. still in splint. Will be applied at [employer] office. Ortho Device/Splint Interventions: Ordered   Post Interventions Patient Tolerated: Well Instructions Provided: Adjustment of device  Adine MARLA Blush 11/22/2023, 12:29 PM

## 2023-11-22 NOTE — Plan of Care (Signed)
  Problem: Education: Goal: Knowledge of General Education information will improve Description: Including pain rating scale, medication(s)/side effects and non-pharmacologic comfort measures Outcome: Progressing   Problem: Health Behavior/Discharge Planning: Goal: Ability to manage health-related needs will improve Outcome: Progressing   Problem: Clinical Measurements: Goal: Will remain free from infection Outcome: Progressing Goal: Diagnostic test results will improve Outcome: Progressing   Problem: Coping: Goal: Level of anxiety will decrease Outcome: Progressing

## 2023-11-22 NOTE — Telephone Encounter (Signed)
 Pharmacy Patient Advocate Encounter   Received notification from Inpatient Request that prior authorization for oxyCODONE  HCl 10MG  tablets is required/requested.   Insurance verification completed.   The patient is insured through [employer] .   Per test claim: PA required; PA submitted to above mentioned insurance via CoverMyMeds Key/confirmation #/EOC [id number] Status is pending
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
## 2023-11-23 ENCOUNTER — Emergency Department (HOSPITAL_COMMUNITY)
Admission: EM | Admit: 2023-11-23 | Discharge: 2023-11-23 | Disposition: A | Discharge status: Home / Self Care | Payer: MEDICAID | Attending: Emergency Medicine | Admitting: Emergency Medicine

## 2023-11-23 ENCOUNTER — Encounter (HOSPITAL_COMMUNITY): Payer: Self-pay | Admitting: Emergency Medicine

## 2023-11-23 ENCOUNTER — Other Ambulatory Visit: Payer: Self-pay

## 2023-11-23 DIAGNOSIS — M79604 Pain in right leg: Secondary | ICD-10-CM

## 2023-11-23 DIAGNOSIS — M79661 Pain in right lower leg: Secondary | ICD-10-CM | POA: Diagnosis present

## 2023-11-23 DIAGNOSIS — G8918 Other acute postprocedural pain: Secondary | ICD-10-CM | POA: Diagnosis not present

## 2023-11-23 MED ORDER — HYDROMORPHONE HCL 1 MG/ML IJ SOLN
2.0000 mg | Freq: Once | INTRAMUSCULAR | Status: AC
  Administered 2023-11-23: 2 mg via INTRAMUSCULAR
  Filled 2023-11-23: qty 2

## 2023-11-23 NOTE — ED Notes (Signed)
 Patient verbalizes understanding of discharge instructions. Opportunity for questioning and answers were provided. Armband removed by staff, pt discharged from ED. Left via WC with mother
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
## 2023-11-23 NOTE — ED Provider Notes (Signed)
 [place] EMERGENCY DEPARTMENT AT [facility] Provider Note   CSN: [id number] Arrival date & time: 11/23/23  0010     Patient presents with: Leg Pain and Recent Surgery   Mary [name] is a 33 y.o. female.   Patient is a 33 year old female with history of polysubstance abuse.  Patient just discharged from [facility] at 6 PM this evening after being treated for an open fracture of her right lower leg.  She states that she bumped her toes on the doorway getting into the house and she has had severe pain unrelieved with the Oxy that was prescribed for her by the orthopedic surgeon.       Prior to Admission medications   Medication Sig Start Date End Date Taking? Authorizing Provider  acetaminophen  (TYLENOL ) 500 MG tablet Take 1,000 mg by mouth every 6 (six) hours as needed for moderate pain (pain score 4-6).    [provider]  albuterol  (PROVENTIL  HFA;VENTOLIN  HFA) 108 (90 BASE) MCG/ACT inhaler Inhale 2 puffs into the lungs every 4 (four) hours as needed for wheezing or shortness of breath. Patient not taking: Reported on 10/02/2023 07/12/13   Haze Lonni PARAS, MD  chlorhexidine  (PERIDEX ) 0.12 % solution Use as directed 15 mLs in the mouth or throat 2 (two) times daily. Patient not taking: Reported on 10/02/2023 12/18/19   Palumbo, April, MD  cyclobenzaprine  (FLEXERIL ) 10 MG tablet Take 1 tablet (10 mg total) by mouth 3 (three) times daily. Patient not taking: Reported on 10/02/2023 12/09/17   Armida Culver, PA-C  dicyclomine  (BENTYL ) 10 MG capsule Take 1 capsule (10 mg total) by mouth 3 (three) times daily as needed for up to 5 days for spasms. 10/22/23 10/27/23  Levander Slate, MD  hydrOXYzine  (VISTARIL ) 50 MG capsule Take 1 capsule (50 mg total) by mouth 3 (three) times daily as needed. 10/22/23   Levander Slate, MD  ibuprofen  (ADVIL ) 200 MG tablet Take 200 mg by mouth every 6 (six) hours as needed for moderate pain (pain score 4-6).    [provider]  ibuprofen   (ADVIL ,MOTRIN ) 400 MG tablet Take 1 tablet (400 mg total) by mouth every 6 (six) hours as needed. Patient not taking: Reported on 10/02/2023 12/09/17   Armida Culver, PA-C  naproxen  (NAPROSYN ) 375 MG tablet Take 1 tablet (375 mg total) by mouth 2 (two) times daily with a meal. Patient not taking: Reported on 10/02/2023 12/18/19   Palumbo, April, MD  potassium chloride  SA (KLOR-CON  M) 20 MEQ tablet Take 1 tablet (20 mEq total) by mouth 2 (two) times daily for 3 days. 10/22/23 10/25/23  Levander Slate, MD  valACYclovir  (VALTREX ) 1000 MG tablet Take 1 daily bid for 5 days then 1 daily Patient not taking: Reported on 09/20/2017 01/25/15   Signa Delon LABOR, NP    Allergies: Hydrocodone , Raspberry, Tramadol , and Other    Review of Systems  All other systems reviewed and are negative.   Updated Vital Signs Pulse (!) 106   Temp 98.1 F (36.7 C) (Oral)   Resp (!) 21   Wt 58 kg   SpO2 97%   BMI 24.97 kg/m   Physical Exam Vitals and nursing note reviewed.  Constitutional:      Appearance: Normal appearance.   Musculoskeletal:     Comments: There is a cast in place.  Toes appear well-perfused.   Skin:    General: Skin is warm and dry.   Neurological:     Mental Status: She is alert and oriented  to person, place, and time.     (all labs ordered are listed, but only abnormal results are displayed) Labs Reviewed - No data to display  EKG: None  Radiology: No results found.   Procedures   Medications Ordered in the ED  HYDROmorphone  (DILAUDID ) injection 2 mg (has no administration in time range)                                    Medical Decision Making Risk Prescription drug management.   Surgical dressing is in place and I do not feel as though it requires removal given the mechanism of her injury.  I have agreed to give 1 dose of Dilaudid , then patient will be discharged.  If she has ongoing pain that is unrelieved with the oxycodone  she was prescribed.  She must speak with  her orthopedic surgeon regarding this.  Patient has long history of polysubstance abuse and I am concerned about drug-seeking behavior.     Final diagnoses:  None    ED Discharge Orders     None          Geroldine Berg, MD 11/23/23 [phone]

## 2023-11-23 NOTE — Discharge Instructions (Signed)
 Continue home medications as previously prescribed.  If the pain medication you have at home is an adequate, you must speak with your orthopedic surgeon regarding additional medications.

## 2023-11-25 ENCOUNTER — Encounter (HOSPITAL_COMMUNITY): Payer: Self-pay | Admitting: Emergency Medicine

## 2023-11-25 NOTE — Telephone Encounter (Signed)
 Clinical Questions have been submitted

## 2023-11-25 NOTE — Telephone Encounter (Signed)
 Pharmacy Patient Advocate Encounter  Received notification from [employer] [place] [employer] that Prior Authorization for oxyCODONE  HCl 10MG  tablets  has been DENIED.  Full denial letter will be uploaded to the media tab. See denial reason below.

## 2023-11-25 NOTE — Telephone Encounter (Signed)
 Pharmacy Patient Advocate Encounter  Received notification from [facility] that Prior Authorization for Lidocaine  5% patches  has been APPROVED from 11/25/2023 to 11/24/2024   PA #/Case ID/Reference #: [id number]

## 2023-11-28 ENCOUNTER — Encounter (HOSPITAL_COMMUNITY): Payer: Self-pay

## 2023-11-28 ENCOUNTER — Emergency Department (HOSPITAL_COMMUNITY)
Admission: EM | Admit: 2023-11-28 | Discharge: 2023-11-28 | Disposition: A | Discharge status: Home / Self Care | Payer: MEDICAID | Attending: Emergency Medicine | Admitting: Emergency Medicine

## 2023-11-28 ENCOUNTER — Other Ambulatory Visit: Payer: Self-pay

## 2023-11-28 DIAGNOSIS — F1910 Other psychoactive substance abuse, uncomplicated: Secondary | ICD-10-CM | POA: Insufficient documentation

## 2023-11-28 DIAGNOSIS — J45909 Unspecified asthma, uncomplicated: Secondary | ICD-10-CM | POA: Insufficient documentation

## 2023-11-28 DIAGNOSIS — M25571 Pain in right ankle and joints of right foot: Secondary | ICD-10-CM | POA: Insufficient documentation

## 2023-11-28 DIAGNOSIS — M79671 Pain in right foot: Secondary | ICD-10-CM

## 2023-11-28 DIAGNOSIS — R000 Tachycardia, unspecified: Secondary | ICD-10-CM | POA: Insufficient documentation

## 2023-11-28 DIAGNOSIS — Z5321 Procedure and treatment not carried out due to patient leaving prior to being seen by health care provider: Secondary | ICD-10-CM | POA: Diagnosis not present

## 2023-11-28 DIAGNOSIS — Z7901 Long term (current) use of anticoagulants: Secondary | ICD-10-CM | POA: Insufficient documentation

## 2023-11-28 LAB — CBG MONITORING, ED: Glucose-Capillary: 183 mg/dL — ABNORMAL HIGH (ref 70–99)

## 2023-11-28 MED ORDER — NALOXONE HCL 4 MG/0.1ML NA LIQD
NASAL | Status: AC
  Filled 2023-11-28: qty 4

## 2023-11-28 MED ORDER — ONDANSETRON HCL 4 MG/2ML IJ SOLN
4.0000 mg | Freq: Once | INTRAMUSCULAR | Status: AC
  Administered 2023-11-28: 4 mg via INTRAVENOUS
  Filled 2023-11-28 (×2): qty 2

## 2023-11-28 MED ORDER — SODIUM CHLORIDE 0.9 % IV BOLUS
1000.0000 mL | Freq: Once | INTRAVENOUS | Status: AC
  Administered 2023-11-28: 1000 mL via INTRAVENOUS

## 2023-11-28 MED ORDER — MORPHINE SULFATE (PF) 4 MG/ML IV SOLN
4.0000 mg | Freq: Once | INTRAVENOUS | Status: DC
  Filled 2023-11-28: qty 1

## 2023-11-28 NOTE — ED Notes (Addendum)
 Family member with pt in the BR, called for nurse, this nurse found pt with agonal respirations slumped over while sitting in the WC, repositioned pt's head to open airway. Yelled for help and Dr. Towana arrived and in room. Assisted pt to bed. Airway opened and placed on monitor and NRB at 15 L/M.  Pt breathing on her own but unresponsive. One dose narcan  given nasally. Pt with unknown brown substance noted to bilateral nares. Family member denies drug use in the BR.
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
## 2023-11-28 NOTE — ED Notes (Signed)
 Attempted to call Pts cell phone to locate Pt. No answer.

## 2023-11-28 NOTE — ED Notes (Addendum)
 Pt told this Clinical research associate  I went to the bathroom and snorted something brown (an opiate) states she did this because the RN was having trouble getting her IV in, I was shaking so I thought that would not make me shake. I did not want to hurt myself, I feel better now.  I don't want to get in trouble. Explained to pt that she received 2 doses of narcan . MD and RN made aware. Family member also states we want to get her into rehab, but she wont go.
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
## 2023-12-09 ENCOUNTER — Ambulatory Visit: Payer: MEDICAID | Admitting: Infectious Diseases

## 2023-12-11 ENCOUNTER — Emergency Department (HOSPITAL_COMMUNITY): Payer: MEDICAID

## 2023-12-11 ENCOUNTER — Other Ambulatory Visit: Payer: Self-pay

## 2023-12-11 ENCOUNTER — Emergency Department (HOSPITAL_COMMUNITY)
Admission: EM | Admit: 2023-12-11 | Discharge: 2023-12-12 | Discharge status: Against Medical Advice | Payer: MEDICAID | Source: Home / Self Care | Attending: Emergency Medicine | Admitting: Emergency Medicine

## 2023-12-11 ENCOUNTER — Emergency Department (HOSPITAL_COMMUNITY)
Admission: EM | Admit: 2023-12-11 | Discharge: 2023-12-11 | Disposition: A | Discharge status: Home / Self Care | Payer: MEDICAID | Attending: Emergency Medicine | Admitting: Emergency Medicine

## 2023-12-11 DIAGNOSIS — F1423 Cocaine dependence with withdrawal: Secondary | ICD-10-CM | POA: Insufficient documentation

## 2023-12-11 DIAGNOSIS — Z5329 Procedure and treatment not carried out because of patient's decision for other reasons: Secondary | ICD-10-CM | POA: Insufficient documentation

## 2023-12-11 DIAGNOSIS — F1123 Opioid dependence with withdrawal: Secondary | ICD-10-CM | POA: Insufficient documentation

## 2023-12-11 DIAGNOSIS — N3000 Acute cystitis without hematuria: Secondary | ICD-10-CM

## 2023-12-11 DIAGNOSIS — Z7901 Long term (current) use of anticoagulants: Secondary | ICD-10-CM | POA: Diagnosis not present

## 2023-12-11 DIAGNOSIS — J45909 Unspecified asthma, uncomplicated: Secondary | ICD-10-CM | POA: Insufficient documentation

## 2023-12-11 DIAGNOSIS — E876 Hypokalemia: Secondary | ICD-10-CM | POA: Diagnosis not present

## 2023-12-11 DIAGNOSIS — R45851 Suicidal ideations: Secondary | ICD-10-CM

## 2023-12-11 DIAGNOSIS — M79606 Pain in leg, unspecified: Secondary | ICD-10-CM | POA: Insufficient documentation

## 2023-12-11 DIAGNOSIS — M25571 Pain in right ankle and joints of right foot: Secondary | ICD-10-CM | POA: Insufficient documentation

## 2023-12-11 LAB — CBC WITH DIFFERENTIAL/PLATELET
Abs Immature Granulocytes: 0.02 K/uL (ref 0.00–0.07)
Basophils Absolute: 0 K/uL (ref 0.0–0.1)
Basophils Relative: 0 %
Eosinophils Absolute: 0.1 K/uL (ref 0.0–0.5)
Eosinophils Relative: 1 %
HCT: 29.1 % — ABNORMAL LOW (ref 36.0–46.0)
Hemoglobin: 8.9 g/dL — ABNORMAL LOW (ref 12.0–15.0)
Immature Granulocytes: 0 %
Lymphocytes Relative: 37 %
Lymphs Abs: 3.1 K/uL (ref 0.7–4.0)
MCH: 26.3 pg (ref 26.0–34.0)
MCHC: 30.6 g/dL (ref 30.0–36.0)
MCV: 85.8 fL (ref 80.0–100.0)
Monocytes Absolute: 0.7 K/uL (ref 0.1–1.0)
Monocytes Relative: 9 %
Neutro Abs: 4.6 K/uL (ref 1.7–7.7)
Neutrophils Relative %: 53 %
Platelets: 430 K/uL — ABNORMAL HIGH (ref 150–400)
RBC: 3.39 MIL/uL — ABNORMAL LOW (ref 3.87–5.11)
RDW: 16.8 % — ABNORMAL HIGH (ref 11.5–15.5)
WBC: 8.5 K/uL (ref 4.0–10.5)
nRBC: 0 % (ref 0.0–0.2)

## 2023-12-11 LAB — BASIC METABOLIC PANEL WITH GFR
Anion gap: 11 (ref 5–15)
BUN: 18 mg/dL (ref 6–20)
CO2: 23 mmol/L (ref 22–32)
Calcium: 8.7 mg/dL — ABNORMAL LOW (ref 8.9–10.3)
Chloride: 103 mmol/L (ref 98–111)
Creatinine, Ser: 0.8 mg/dL (ref 0.44–1.00)
GFR, Estimated: >60 mL/min (ref >60)
Glucose, Bld: 120 mg/dL — ABNORMAL HIGH (ref 70–99)
Potassium: 2.9 mmol/L — ABNORMAL LOW (ref 3.5–5.1)
Sodium: 137 mmol/L (ref 135–145)

## 2023-12-11 MED ORDER — NALOXONE HCL 0.4 MG/ML IJ SOLN
0.2000 mg | Freq: Once | INTRAMUSCULAR | Status: AC
  Administered 2023-12-11: 0.2 mg via INTRAVENOUS
  Filled 2023-12-11: qty 1

## 2023-12-11 MED ORDER — SODIUM CHLORIDE 0.9 % IV BOLUS
1000.0000 mL | Freq: Once | INTRAVENOUS | Status: AC
  Administered 2023-12-11: 1000 mL via INTRAVENOUS

## 2023-12-11 MED ORDER — POTASSIUM CHLORIDE CRYS ER 20 MEQ PO TBCR
40.0000 meq | EXTENDED_RELEASE_TABLET | Freq: Once | ORAL | Status: AC
  Administered 2023-12-11: 40 meq via ORAL
  Filled 2023-12-11: qty 2

## 2023-12-11 NOTE — ED Provider Notes (Signed)
 Patient awoke, clinically sober for discharge.  Was then requesting to be checked in because she does not think she should be out on the street with her foot.  Upon further questioning she stated that she is paranoid schizophrenic and thinks people are out there.  however it seems that these are chronic symptoms for her and unchanged.  Denies SI, HI.  She has linear goal-directed thought content.  I believe there may be secondary intention given her associated socioeconomic status.  Discussed the [facility].  She also states she does not have a walker; will give her crutches.  Will discharge as planned.   Neysa Caron PARAS, DO 12/11/23 1806

## 2023-12-11 NOTE — ED Notes (Signed)
 Pt is still very drowsy at this time.

## 2023-12-11 NOTE — ED Notes (Signed)
 Provider in room speaking with patient and she is drowsy at this time unable to stay alert while he is speaking to her. VS stable.

## 2023-12-11 NOTE — Discharge Instructions (Addendum)
 Be sure to follow-up with both your orthopedic surgeon and your primary care physician.  In particular, with your primary care physician to have repeat blood drawn to ensure that your electrolytes have normalized.

## 2023-12-11 NOTE — ED Notes (Signed)
 Pt given a lunch tray, she is falling asleep while eating

## 2023-12-11 NOTE — ED Provider Notes (Signed)
 Care of the patient assumed at signout.  Patient awaiting additional potassium repletion, rewrapping of her ankle, then appropriate for discharge.   Garrick Charleston, MD 12/11/23 [phone]

## 2023-12-11 NOTE — ED Notes (Signed)
 Patient still drowsy and hard to arouse at DC RN informed MD and Charge RN, MD advised RN to allow patient to rest until more A&O and safe for DC.

## 2023-12-11 NOTE — ED Notes (Signed)
 Woke pt up, states she feels out of it. Encouraged pt to eat and drink
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
## 2023-12-11 NOTE — ED Notes (Signed)
 Pt is now awake, stating she can't walk without her walker- also stating she needs to be admitted to psych because she's schizophrenic and has a drug problem. Denies SI/HI- MD notified.
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
## 2023-12-12 ENCOUNTER — Inpatient Hospital Stay: Admission: RE | Admit: 2023-12-12 | Payer: Self-pay | Source: Intra-hospital | Admitting: Psychiatry

## 2023-12-12 LAB — CBC WITH DIFFERENTIAL/PLATELET
Abs Immature Granulocytes: 0.02 K/uL (ref 0.00–0.07)
Basophils Absolute: 0 K/uL (ref 0.0–0.1)
Basophils Relative: 0 %
Eosinophils Absolute: 0.1 K/uL (ref 0.0–0.5)
Eosinophils Relative: 1 %
HCT: 30.1 % — ABNORMAL LOW (ref 36.0–46.0)
Hemoglobin: 9.4 g/dL — ABNORMAL LOW (ref 12.0–15.0)
Immature Granulocytes: 0 %
Lymphocytes Relative: 34 %
Lymphs Abs: 3.7 K/uL (ref 0.7–4.0)
MCH: 27 pg (ref 26.0–34.0)
MCHC: 31.2 g/dL (ref 30.0–36.0)
MCV: 86.5 fL (ref 80.0–100.0)
Monocytes Absolute: 0.6 K/uL (ref 0.1–1.0)
Monocytes Relative: 5 %
Neutro Abs: 6.6 K/uL (ref 1.7–7.7)
Neutrophils Relative %: 60 %
Platelets: 412 K/uL — ABNORMAL HIGH (ref 150–400)
RBC: 3.48 MIL/uL — ABNORMAL LOW (ref 3.87–5.11)
RDW: 17 % — ABNORMAL HIGH (ref 11.5–15.5)
WBC: 10.9 K/uL — ABNORMAL HIGH (ref 4.0–10.5)
nRBC: 0 % (ref 0.0–0.2)

## 2023-12-12 LAB — COMPREHENSIVE METABOLIC PANEL WITH GFR
ALT: 24 U/L (ref 0–44)
AST: 23 U/L (ref 15–41)
Albumin: 3.4 g/dL — ABNORMAL LOW (ref 3.5–5.0)
Alkaline Phosphatase: 88 U/L (ref 38–126)
Anion gap: 10 (ref 5–15)
BUN: 12 mg/dL (ref 6–20)
CO2: 24 mmol/L (ref 22–32)
Calcium: 8.8 mg/dL — ABNORMAL LOW (ref 8.9–10.3)
Chloride: 106 mmol/L (ref 98–111)
Creatinine, Ser: 0.58 mg/dL (ref 0.44–1.00)
GFR, Estimated: >60 mL/min (ref >60)
Glucose, Bld: 99 mg/dL (ref 70–99)
Potassium: 3.3 mmol/L — ABNORMAL LOW (ref 3.5–5.1)
Sodium: 140 mmol/L (ref 135–145)
Total Bilirubin: 0.4 mg/dL (ref 0.0–1.2)
Total Protein: 7 g/dL (ref 6.5–8.1)

## 2023-12-12 LAB — URINALYSIS, ROUTINE W REFLEX MICROSCOPIC
Bilirubin Urine: NEGATIVE
Glucose, UA: NEGATIVE mg/dL
Hgb urine dipstick: NEGATIVE
Ketones, ur: NEGATIVE mg/dL
Nitrite: POSITIVE — AB
Protein, ur: NEGATIVE mg/dL
Specific Gravity, Urine: 1.016 (ref 1.005–1.030)
WBC, UA: >50 WBC/hpf (ref 0–5)
pH: 6 (ref 5.0–8.0)

## 2023-12-12 LAB — RAPID URINE DRUG SCREEN, HOSP PERFORMED
Amphetamines: POSITIVE — AB
Barbiturates: NOT DETECTED
Benzodiazepines: NOT DETECTED
Cocaine: POSITIVE — AB
Opiates: NOT DETECTED
Tetrahydrocannabinol: POSITIVE — AB

## 2023-12-12 LAB — ETHANOL: Alcohol, Ethyl (B): <15 mg/dL (ref <15)

## 2023-12-12 LAB — PREGNANCY, URINE: Preg Test, Ur: NEGATIVE

## 2023-12-12 MED ORDER — CEPHALEXIN 500 MG PO CAPS
500.0000 mg | ORAL_CAPSULE | Freq: Once | ORAL | Status: AC
  Administered 2023-12-12: 500 mg via ORAL
  Filled 2023-12-12: qty 1

## 2023-12-12 MED ORDER — IBUPROFEN 400 MG PO TABS
600.0000 mg | ORAL_TABLET | Freq: Once | ORAL | Status: AC
  Administered 2023-12-12: 600 mg via ORAL
  Filled 2023-12-12: qty 2

## 2023-12-12 NOTE — ED Notes (Signed)
 Gave pt crayons and coloring book.  Advised no pens.

## 2023-12-12 NOTE — ED Notes (Signed)
 Tts finished.

## 2023-12-12 NOTE — ED Notes (Signed)
Patient coloring °

## 2023-12-12 NOTE — ED Notes (Signed)
 Family left; gave mother information on [facility] guidelines and visitation.
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
## 2023-12-12 NOTE — Progress Notes (Signed)
 Pt was accepted to [facility] on 12/12/2023 Bed assignment:[id number]   [address] [place] [address]   Pt meets inpatient criteria per Roxianne Olp, NP   Attending Physician will be: Dr.Jadepalle    Report can be called to: [phone]  Pt can arrive after ASAP  Care Team Notified:  [facility] Bretta Qua, RN, [place] Nickerson, RN

## 2023-12-12 NOTE — ED Notes (Signed)
 Pt fanny pack was found with under her clothes. Pt denied it but CN requested to check and found it. Pt had items that security had to flush down the toilet. Other items were put into her personal bags in locker 4. Pt continues to have medical boot and crutches at bedside. Pt was calm and cooperative with CN. Pt did ask if she could leave because she is voluntary, CN stated she would ask her primary nurse.

## 2023-12-12 NOTE — ED Notes (Signed)
 Pt is in the shower

## 2023-12-12 NOTE — ED Notes (Signed)
 Pt requesting something for pain, something for withdrawals, and nicotine  patch. Secure chat sent to Dr. Cleotilde regarding same.

## 2023-12-12 NOTE — BH Assessment (Signed)
 Clinician messaged Ileana Borer,, RN: Walterine. It's Trey with TTS. Is the pt able to engage in the assessment, is the pt under IVC? Also is the pt medically cleared?  Clinician awaiting response.    Jackson JONETTA Broach, MS, [facility], [facility] Triage Specialist [phone]

## 2023-12-12 NOTE — ED Provider Notes (Signed)
 Emergency Medicine Observation Re-evaluation Note  Mary [name] is a 33 y.o. female, seen on rounds today.  Pt initially presented to the ED for complaints of Withdrawal Currently, the patient is stable awake and alert.  Physical Exam  BP (!) 119/98 (BP Location: Left Arm)   Pulse (!) 109   Temp 98 F (36.7 C) (Oral)   Resp 18   Ht 1.575 m (5' 2)   Wt 56.7 kg   LMP  (LMP Unknown)   SpO2 99%   BMI 22.86 kg/m  Physical Exam General: No distress Cardiac: Not tachycardic at this time Lungs: Clear to auscultation Psych: Has been back-and-forth to the shower, has been able to ambulate, awake and alert and following commands, does not appear to be responding to internal stimuli  ED Course / MDM  EKG:   I have reviewed the labs performed to date as well as medications administered while in observation.  Recent changes in the last 24 hours include the patient has been accepted at psychiatric facility.  Plan  Current plan is for transfer to psychiatric facility, also has UTI, cystitis treated with cephalexin , this will need to be continued at her excepting facility.    Cleotilde Rogue, MD 12/12/23 1025

## 2023-12-12 NOTE — ED Notes (Signed)
 Pt requesting to leave instead of going to assigned bed at [facility]. Dr. Cleotilde aware, pt is voluntary and denies SI, HI so she can leave as requested.
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
## 2023-12-31 ENCOUNTER — Other Ambulatory Visit: Payer: Self-pay

## 2023-12-31 ENCOUNTER — Emergency Department (HOSPITAL_COMMUNITY): Payer: MEDICAID

## 2023-12-31 ENCOUNTER — Emergency Department (HOSPITAL_COMMUNITY)
Admission: EM | Admit: 2023-12-31 | Discharge: 2023-12-31 | Disposition: A | Discharge status: Home / Self Care | Payer: MEDICAID | Attending: Student | Admitting: Student

## 2023-12-31 ENCOUNTER — Encounter (HOSPITAL_COMMUNITY): Payer: Self-pay | Admitting: Emergency Medicine

## 2023-12-31 DIAGNOSIS — R7401 Elevation of levels of liver transaminase levels: Secondary | ICD-10-CM | POA: Diagnosis not present

## 2023-12-31 DIAGNOSIS — J45909 Unspecified asthma, uncomplicated: Secondary | ICD-10-CM | POA: Insufficient documentation

## 2023-12-31 DIAGNOSIS — F1193 Opioid use, unspecified with withdrawal: Secondary | ICD-10-CM

## 2023-12-31 DIAGNOSIS — F1113 Opioid abuse with withdrawal: Secondary | ICD-10-CM | POA: Diagnosis not present

## 2023-12-31 DIAGNOSIS — F17210 Nicotine dependence, cigarettes, uncomplicated: Secondary | ICD-10-CM | POA: Diagnosis not present

## 2023-12-31 DIAGNOSIS — W19XXXA Unspecified fall, initial encounter: Secondary | ICD-10-CM | POA: Diagnosis not present

## 2023-12-31 DIAGNOSIS — M25571 Pain in right ankle and joints of right foot: Secondary | ICD-10-CM | POA: Diagnosis present

## 2023-12-31 DIAGNOSIS — Z7901 Long term (current) use of anticoagulants: Secondary | ICD-10-CM | POA: Diagnosis not present

## 2023-12-31 LAB — COMPREHENSIVE METABOLIC PANEL WITH GFR
ALT: 320 U/L — ABNORMAL HIGH (ref 0–44)
AST: 149 U/L — ABNORMAL HIGH (ref 15–41)
Albumin: 4 g/dL (ref 3.5–5.0)
Alkaline Phosphatase: 139 U/L — ABNORMAL HIGH (ref 38–126)
Anion gap: 13 (ref 5–15)
BUN: 20 mg/dL (ref 6–20)
CO2: 23 mmol/L (ref 22–32)
Calcium: 9.7 mg/dL (ref 8.9–10.3)
Chloride: 101 mmol/L (ref 98–111)
Creatinine, Ser: 0.68 mg/dL (ref 0.44–1.00)
GFR, Estimated: >60 mL/min (ref >60)
Glucose, Bld: 126 mg/dL — ABNORMAL HIGH (ref 70–99)
Potassium: 3.1 mmol/L — ABNORMAL LOW (ref 3.5–5.1)
Sodium: 137 mmol/L (ref 135–145)
Total Bilirubin: 1.2 mg/dL (ref 0.0–1.2)
Total Protein: 8 g/dL (ref 6.5–8.1)

## 2023-12-31 LAB — CBC WITH DIFFERENTIAL/PLATELET
Abs Immature Granulocytes: 0.02 K/uL (ref 0.00–0.07)
Basophils Absolute: 0 K/uL (ref 0.0–0.1)
Basophils Relative: 0 %
Eosinophils Absolute: 0 K/uL (ref 0.0–0.5)
Eosinophils Relative: 0 %
HCT: 33.6 % — ABNORMAL LOW (ref 36.0–46.0)
Hemoglobin: 10.6 g/dL — ABNORMAL LOW (ref 12.0–15.0)
Immature Granulocytes: 0 %
Lymphocytes Relative: 39 %
Lymphs Abs: 3.6 K/uL (ref 0.7–4.0)
MCH: 26.2 pg (ref 26.0–34.0)
MCHC: 31.5 g/dL (ref 30.0–36.0)
MCV: 83 fL (ref 80.0–100.0)
Monocytes Absolute: 0.6 K/uL (ref 0.1–1.0)
Monocytes Relative: 7 %
Neutro Abs: 4.9 K/uL (ref 1.7–7.7)
Neutrophils Relative %: 54 %
Platelets: 429 K/uL — ABNORMAL HIGH (ref 150–400)
RBC: 4.05 MIL/uL (ref 3.87–5.11)
RDW: 16.3 % — ABNORMAL HIGH (ref 11.5–15.5)
WBC: 9.2 K/uL (ref 4.0–10.5)
nRBC: 0 % (ref 0.0–0.2)

## 2023-12-31 LAB — HEPATITIS PANEL, ACUTE
HCV Ab: REACTIVE — AB
Hep A IgM: NONREACTIVE
Hep B C IgM: NONREACTIVE
Hepatitis B Surface Ag: NONREACTIVE

## 2023-12-31 LAB — SEDIMENTATION RATE: Sed Rate: 22 mm/h — ABNORMAL HIGH (ref 0–20)

## 2023-12-31 LAB — C-REACTIVE PROTEIN: CRP: 0.5 mg/dL (ref <1.0)

## 2023-12-31 LAB — HCG, QUANTITATIVE, PREGNANCY: hCG, Beta Chain, Quant, S: <1 m[IU]/mL (ref <5)

## 2023-12-31 MED ORDER — LORAZEPAM 2 MG/ML IJ SOLN
1.0000 mg | Freq: Once | INTRAMUSCULAR | Status: AC
  Administered 2023-12-31: 1 mg via INTRAVENOUS
  Filled 2023-12-31: qty 1

## 2023-12-31 MED ORDER — BUPRENORPHINE HCL-NALOXONE HCL 8-2 MG SL FILM: 1.0000 | ORAL_FILM | Freq: Every day | SUBLINGUAL | 0 refills | Status: AC

## 2023-12-31 MED ORDER — ONDANSETRON HCL 4 MG/2ML IJ SOLN
4.0000 mg | Freq: Once | INTRAMUSCULAR | Status: AC
  Administered 2023-12-31: 4 mg via INTRAVENOUS
  Filled 2023-12-31: qty 2

## 2023-12-31 MED ORDER — DIPHENHYDRAMINE HCL 50 MG/ML IJ SOLN
25.0000 mg | Freq: Once | INTRAMUSCULAR | Status: AC
  Administered 2023-12-31: 25 mg via INTRAVENOUS
  Filled 2023-12-31: qty 1

## 2023-12-31 MED ORDER — LACTATED RINGERS IV BOLUS
1000.0000 mL | Freq: Once | INTRAVENOUS | Status: AC
  Administered 2023-12-31: 1000 mL via INTRAVENOUS

## 2023-12-31 MED ORDER — PROCHLORPERAZINE EDISYLATE 10 MG/2ML IJ SOLN
10.0000 mg | Freq: Once | INTRAMUSCULAR | Status: AC
  Administered 2023-12-31: 10 mg via INTRAVENOUS
  Filled 2023-12-31: qty 2

## 2023-12-31 MED ORDER — DROPERIDOL 2.5 MG/ML IJ SOLN
1.2500 mg | Freq: Once | INTRAMUSCULAR | Status: AC
  Administered 2023-12-31: 1.25 mg via INTRAVENOUS
  Filled 2023-12-31: qty 2

## 2023-12-31 MED ORDER — FENTANYL CITRATE (PF) 100 MCG/2ML IJ SOLN
100.0000 ug | Freq: Once | INTRAMUSCULAR | Status: AC
  Administered 2023-12-31: 100 ug via INTRAVENOUS
  Filled 2023-12-31: qty 2

## 2023-12-31 MED ORDER — BUPRENORPHINE HCL-NALOXONE HCL 8-2 MG SL SUBL
1.0000 | SUBLINGUAL_TABLET | Freq: Once | SUBLINGUAL | Status: AC
  Administered 2023-12-31: 1 via SUBLINGUAL
  Filled 2023-12-31: qty 1

## 2023-12-31 MED ORDER — DIAZEPAM 5 MG/ML IJ SOLN
5.0000 mg | Freq: Once | INTRAMUSCULAR | Status: AC
  Administered 2023-12-31: 5 mg via INTRAVENOUS
  Filled 2023-12-31: qty 2

## 2023-12-31 MED ORDER — IOHEXOL 300 MG/ML  SOLN
100.0000 mL | Freq: Once | INTRAMUSCULAR | Status: AC | PRN
  Administered 2023-12-31: 100 mL via INTRAVENOUS

## 2023-12-31 NOTE — ED Notes (Signed)
 ED Provider at bedside.

## 2023-12-31 NOTE — ED Notes (Signed)
 Patient transported to CT
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
## 2024-01-05 NOTE — Brief Op Note (Signed)
 11/19/2023  PATIENT:  Mary [name]  33 y.o. female  PROCEDURE:  Procedure(s): OPEN REDUCTION INTERNAL FIXATION (ORIF) ANKLE FRACTURE (Right) REMOVAL, EXTERNAL FIXATION DEVICE, LOWER EXTREMITY (Right) IRRIGATION AND DEBRIDEMENT, OPEN FRACTURE (Right) REPAIR ANKLE DISLOCATION RETENTION SUTURE CLOSURE WOUND VAC  SURGEON:  Surgeons and Role:    DEWAINE Celena Sharper, MD - Primary  [id number]
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
## 2024-01-29 ENCOUNTER — Ambulatory Visit: Payer: MEDICAID | Attending: Physician Assistant | Admitting: Physical Therapy

## 2024-01-29 ENCOUNTER — Encounter: Payer: Self-pay | Admitting: Physical Therapy

## 2024-01-29 ENCOUNTER — Other Ambulatory Visit: Payer: Self-pay

## 2024-01-29 DIAGNOSIS — M25571 Pain in right ankle and joints of right foot: Secondary | ICD-10-CM | POA: Insufficient documentation

## 2024-01-29 DIAGNOSIS — M6281 Muscle weakness (generalized): Secondary | ICD-10-CM | POA: Insufficient documentation

## 2024-01-29 DIAGNOSIS — R2689 Other abnormalities of gait and mobility: Secondary | ICD-10-CM | POA: Diagnosis present

## 2024-02-03 ENCOUNTER — Ambulatory Visit: Payer: Self-pay | Admitting: Nurse Practitioner

## 2024-02-04 ENCOUNTER — Ambulatory Visit (HOSPITAL_COMMUNITY): Payer: MEDICAID

## 2024-02-05 ENCOUNTER — Ambulatory Visit: Payer: MEDICAID | Admitting: Infectious Disease

## 2024-02-07 ENCOUNTER — Other Ambulatory Visit: Payer: Self-pay

## 2024-02-07 ENCOUNTER — Emergency Department (HOSPITAL_COMMUNITY)
Admission: EM | Admit: 2024-02-07 | Discharge: 2024-02-08 | Disposition: A | Discharge status: Home / Self Care | Payer: MEDICAID | Attending: Emergency Medicine | Admitting: Emergency Medicine

## 2024-02-07 ENCOUNTER — Encounter (HOSPITAL_COMMUNITY): Payer: Self-pay | Admitting: Emergency Medicine

## 2024-02-07 DIAGNOSIS — F1120 Opioid dependence, uncomplicated: Secondary | ICD-10-CM | POA: Diagnosis not present

## 2024-02-07 DIAGNOSIS — R509 Fever, unspecified: Secondary | ICD-10-CM | POA: Diagnosis not present

## 2024-02-07 DIAGNOSIS — F1190 Opioid use, unspecified, uncomplicated: Secondary | ICD-10-CM | POA: Diagnosis not present

## 2024-02-07 DIAGNOSIS — F17210 Nicotine dependence, cigarettes, uncomplicated: Secondary | ICD-10-CM | POA: Diagnosis not present

## 2024-02-07 DIAGNOSIS — R110 Nausea: Secondary | ICD-10-CM | POA: Diagnosis present

## 2024-02-07 DIAGNOSIS — Z7901 Long term (current) use of anticoagulants: Secondary | ICD-10-CM | POA: Diagnosis not present

## 2024-02-07 DIAGNOSIS — Z79899 Other long term (current) drug therapy: Secondary | ICD-10-CM | POA: Diagnosis not present

## 2024-02-07 DIAGNOSIS — R112 Nausea with vomiting, unspecified: Secondary | ICD-10-CM | POA: Diagnosis present

## 2024-02-07 DIAGNOSIS — J45909 Unspecified asthma, uncomplicated: Secondary | ICD-10-CM | POA: Diagnosis not present

## 2024-02-07 LAB — COMPREHENSIVE METABOLIC PANEL WITH GFR
ALT: 57 U/L — ABNORMAL HIGH (ref 0–44)
AST: 43 U/L — ABNORMAL HIGH (ref 15–41)
Albumin: 3.6 g/dL (ref 3.5–5.0)
Alkaline Phosphatase: 103 U/L (ref 38–126)
Anion gap: 12 (ref 5–15)
BUN: 9 mg/dL (ref 6–20)
CO2: 23 mmol/L (ref 22–32)
Calcium: 9.1 mg/dL (ref 8.9–10.3)
Chloride: 103 mmol/L (ref 98–111)
Creatinine, Ser: 0.56 mg/dL (ref 0.44–1.00)
GFR, Estimated: >60 mL/min (ref >60)
Glucose, Bld: 113 mg/dL — ABNORMAL HIGH (ref 70–99)
Potassium: 3.3 mmol/L — ABNORMAL LOW (ref 3.5–5.1)
Sodium: 138 mmol/L (ref 135–145)
Total Bilirubin: 0.5 mg/dL (ref 0.0–1.2)
Total Protein: 8.4 g/dL — ABNORMAL HIGH (ref 6.5–8.1)

## 2024-02-07 LAB — CBC WITH DIFFERENTIAL/PLATELET
Abs Immature Granulocytes: 0.03 K/uL (ref 0.00–0.07)
Basophils Absolute: 0 K/uL (ref 0.0–0.1)
Basophils Relative: 0 %
Eosinophils Absolute: 0 K/uL (ref 0.0–0.5)
Eosinophils Relative: 0 %
HCT: 37.6 % (ref 36.0–46.0)
Hemoglobin: 11.8 g/dL — ABNORMAL LOW (ref 12.0–15.0)
Immature Granulocytes: 0 %
Lymphocytes Relative: 28 %
Lymphs Abs: 2.3 K/uL (ref 0.7–4.0)
MCH: 25.6 pg — ABNORMAL LOW (ref 26.0–34.0)
MCHC: 31.4 g/dL (ref 30.0–36.0)
MCV: 81.6 fL (ref 80.0–100.0)
Monocytes Absolute: 0.3 K/uL (ref 0.1–1.0)
Monocytes Relative: 4 %
Neutro Abs: 5.6 K/uL (ref 1.7–7.7)
Neutrophils Relative %: 68 %
Platelets: 491 K/uL — ABNORMAL HIGH (ref 150–400)
RBC: 4.61 MIL/uL (ref 3.87–5.11)
RDW: 16.2 % — ABNORMAL HIGH (ref 11.5–15.5)
WBC: 8.3 K/uL (ref 4.0–10.5)
nRBC: 0 % (ref 0.0–0.2)

## 2024-02-07 LAB — LIPASE, BLOOD: Lipase: 33 U/L (ref 11–51)

## 2024-02-07 LAB — ETHANOL: Alcohol, Ethyl (B): <15 mg/dL (ref <15)

## 2024-02-07 MED ORDER — FENTANYL CITRATE (PF) 100 MCG/2ML IJ SOLN: 25.0000 ug | Freq: Once | INTRAMUSCULAR | Status: DC

## 2024-02-07 MED ORDER — DIAZEPAM 5 MG/ML IJ SOLN
5.0000 mg | Freq: Once | INTRAMUSCULAR | Status: AC
  Administered 2024-02-07: 5 mg via INTRAVENOUS
  Filled 2024-02-07: qty 2

## 2024-02-07 MED ORDER — ONDANSETRON HCL 4 MG/2ML IJ SOLN
4.0000 mg | Freq: Once | INTRAMUSCULAR | Status: AC
  Administered 2024-02-07: 4 mg via INTRAVENOUS
  Filled 2024-02-07: qty 2

## 2024-02-07 MED ORDER — FENTANYL CITRATE (PF) 100 MCG/2ML IJ SOLN
50.0000 ug | Freq: Once | INTRAMUSCULAR | Status: AC
  Administered 2024-02-07: 50 ug via INTRAVENOUS
  Filled 2024-02-07: qty 2

## 2024-02-07 MED ORDER — SODIUM CHLORIDE 0.9 % IV BOLUS
1000.0000 mL | Freq: Once | INTRAVENOUS | Status: AC
  Administered 2024-02-07: 1000 mL via INTRAVENOUS

## 2024-02-07 MED ORDER — FENTANYL CITRATE (PF) 100 MCG/2ML IJ SOLN: 50.0000 ug | Freq: Once | INTRAMUSCULAR | Status: DC

## 2024-02-07 NOTE — ED Notes (Signed)
 Pt has been resting comfortably since she was triaged. Pt has not had any issues with vomiting.
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
## 2024-02-07 NOTE — ED Triage Notes (Signed)
 Pt to the ED with [facility] with complaints of withdrawals. Pt has states she has chills, fever and N/V.  Pt had Tib/fib surgery in June and is on crutches and non weight bearing.  Pt states she had a suboxone  today and the symptoms started right after taking it.
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
## 2024-02-07 NOTE — Discharge Instructions (Addendum)
 I strongly recommend you seek treatment for your addiction.  [facility] here in [place] would be a good place for you to obtain this assistance.  In the interim return here if you have any worsening symptoms.  Your labs and exam today are reassuring.

## 2024-02-08 ENCOUNTER — Emergency Department (HOSPITAL_COMMUNITY)
Admission: EM | Admit: 2024-02-08 | Discharge: 2024-02-08 | Disposition: A | Discharge status: Home / Self Care | Payer: MEDICAID | Source: Home / Self Care | Attending: Emergency Medicine | Admitting: Emergency Medicine

## 2024-02-08 DIAGNOSIS — J45909 Unspecified asthma, uncomplicated: Secondary | ICD-10-CM | POA: Insufficient documentation

## 2024-02-08 DIAGNOSIS — Z7901 Long term (current) use of anticoagulants: Secondary | ICD-10-CM | POA: Insufficient documentation

## 2024-02-08 DIAGNOSIS — F1190 Opioid use, unspecified, uncomplicated: Secondary | ICD-10-CM | POA: Insufficient documentation

## 2024-02-08 DIAGNOSIS — F17210 Nicotine dependence, cigarettes, uncomplicated: Secondary | ICD-10-CM | POA: Insufficient documentation
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
## 2024-02-08 NOTE — ED Triage Notes (Signed)
 Pt was seen here yesterday for same and slept in the lobby as she is homeless and states withdrawing from IV fentanyl  use, with Sx of fever, chills, and diaphoresis. Denies SI/HI.states she is one day without IV fentanyl 

## 2024-02-08 NOTE — ED Notes (Signed)
 Pt ambulated to the bathroom and back to hallway bed

## 2024-02-08 NOTE — Discharge Instructions (Addendum)
 We have given you resources for inpatient and outpatient treatment. Please call to obtain resources
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
## 2024-02-11 ENCOUNTER — Ambulatory Visit: Payer: MEDICAID | Admitting: Physical Therapy

## 2024-02-11 ENCOUNTER — Telehealth: Payer: Self-pay | Admitting: Physical Therapy

## 2024-02-11 NOTE — Telephone Encounter (Signed)
 Spoke to patient's mother and she said that [place] did not have a ride today. She doesn't think that they will be able to make it twice per week, but they are hoping to make it on 9/18. Requested she call if she can't make it.
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
## 2024-02-13 ENCOUNTER — Telehealth: Payer: Self-pay | Admitting: Physical Therapy

## 2024-02-13 ENCOUNTER — Ambulatory Visit: Payer: MEDICAID | Admitting: Physical Therapy

## 2024-02-13 NOTE — Telephone Encounter (Signed)
 Phoned patient's mother Juanita (only contact phone) and told her that due to patient's missed appt, her remaining appts would be cancelled. Also told her that if patient wanted to continue PT she could call back and make an appt and then contact Medicaid and they could set up transportation.

## 2024-02-18 ENCOUNTER — Ambulatory Visit: Payer: MEDICAID | Admitting: Physical Therapy

## 2024-02-20 ENCOUNTER — Encounter: Payer: MEDICAID | Admitting: Physical Therapy

## 2024-02-24 ENCOUNTER — Ambulatory Visit: Payer: MEDICAID | Admitting: Internal Medicine

## 2024-02-25 ENCOUNTER — Encounter: Payer: MEDICAID | Admitting: Physical Therapy

## 2024-02-27 ENCOUNTER — Encounter: Payer: MEDICAID | Admitting: Physical Therapy

## 2024-03-03 ENCOUNTER — Encounter: Payer: MEDICAID | Admitting: Physical Therapy

## 2024-03-05 ENCOUNTER — Other Ambulatory Visit: Payer: Self-pay

## 2024-03-05 ENCOUNTER — Encounter: Payer: MEDICAID | Admitting: Physical Therapy

## 2024-03-05 ENCOUNTER — Emergency Department (HOSPITAL_COMMUNITY)
Admission: EM | Admit: 2024-03-05 | Discharge: 2024-03-06 | Disposition: A | Discharge status: Home / Self Care | Payer: MEDICAID | Attending: Emergency Medicine | Admitting: Emergency Medicine

## 2024-03-05 DIAGNOSIS — R000 Tachycardia, unspecified: Secondary | ICD-10-CM | POA: Insufficient documentation

## 2024-03-05 DIAGNOSIS — F1910 Other psychoactive substance abuse, uncomplicated: Secondary | ICD-10-CM | POA: Insufficient documentation

## 2024-03-05 DIAGNOSIS — Z79899 Other long term (current) drug therapy: Secondary | ICD-10-CM | POA: Diagnosis not present

## 2024-03-05 MED ORDER — BENZTROPINE MESYLATE 1 MG/ML IJ SOLN
1.0000 mg | Freq: Once | INTRAMUSCULAR | Status: AC
Start: 2024-03-06 — End: 2024-03-06
  Administered 2024-03-06 (×2): 1 mg via INTRAMUSCULAR
  Filled 2024-03-05: qty 2

## 2024-03-05 MED ORDER — SODIUM CHLORIDE 0.9 % IV BOLUS
500.0000 mL | Freq: Once | INTRAVENOUS | Status: AC
  Administered 2024-03-06: 500 mL via INTRAVENOUS

## 2024-03-05 NOTE — ED Triage Notes (Addendum)
 Patient c/o SOB and chest pain after self-injecting cocaine. Patient restless and erratic behavior in triage.

## 2024-03-06 LAB — COMPREHENSIVE METABOLIC PANEL WITH GFR
ALT: 50 U/L — ABNORMAL HIGH (ref 0–44)
AST: 49 U/L — ABNORMAL HIGH (ref 15–41)
Albumin: 4.3 g/dL (ref 3.5–5.0)
Alkaline Phosphatase: 94 U/L (ref 38–126)
Anion gap: 11 (ref 5–15)
BUN: 19 mg/dL (ref 6–20)
CO2: 26 mmol/L (ref 22–32)
Calcium: 9.4 mg/dL (ref 8.9–10.3)
Chloride: 103 mmol/L (ref 98–111)
Creatinine, Ser: 0.67 mg/dL (ref 0.44–1.00)
GFR, Estimated: >60 mL/min (ref >60)
Glucose, Bld: 87 mg/dL (ref 70–99)
Potassium: 3.9 mmol/L (ref 3.5–5.1)
Sodium: 140 mmol/L (ref 135–145)
Total Bilirubin: 0.2 mg/dL (ref 0.0–1.2)
Total Protein: 7.7 g/dL (ref 6.5–8.1)

## 2024-03-06 LAB — URINE DRUG SCREEN
Amphetamines: NEGATIVE
Barbiturates: NEGATIVE
Benzodiazepines: NEGATIVE
Cocaine: POSITIVE — AB
Fentanyl: POSITIVE — AB
Methadone Scn, Ur: NEGATIVE
Opiates: NEGATIVE
Tetrahydrocannabinol: POSITIVE — AB

## 2024-03-06 LAB — CBC
HCT: 35.6 % — ABNORMAL LOW (ref 36.0–46.0)
Hemoglobin: 10.5 g/dL — ABNORMAL LOW (ref 12.0–15.0)
MCH: 24.8 pg — ABNORMAL LOW (ref 26.0–34.0)
MCHC: 29.5 g/dL — ABNORMAL LOW (ref 30.0–36.0)
MCV: 84 fL (ref 80.0–100.0)
Platelets: 431 K/uL — ABNORMAL HIGH (ref 150–400)
RBC: 4.24 MIL/uL (ref 3.87–5.11)
RDW: 18.2 % — ABNORMAL HIGH (ref 11.5–15.5)
WBC: 13.3 K/uL — ABNORMAL HIGH (ref 4.0–10.5)
nRBC: 0 % (ref 0.0–0.2)

## 2024-03-06 LAB — ACETAMINOPHEN LEVEL: Acetaminophen (Tylenol), Serum: <10 ug/mL — ABNORMAL LOW (ref 10–30)

## 2024-03-06 LAB — MAGNESIUM: Magnesium: 2 mg/dL (ref 1.7–2.4)

## 2024-03-06 LAB — SALICYLATE LEVEL: Salicylate Lvl: <7 mg/dL — ABNORMAL LOW (ref 7.0–30.0)

## 2024-03-06 LAB — HCG, SERUM, QUALITATIVE: Preg, Serum: NEGATIVE

## 2024-03-06 LAB — CBG MONITORING, ED: Glucose-Capillary: 109 mg/dL — ABNORMAL HIGH (ref 70–99)

## 2024-03-06 LAB — ETHANOL: Alcohol, Ethyl (B): <15 mg/dL (ref <15)

## 2024-03-06 NOTE — ED Provider Notes (Signed)
 [place] EMERGENCY DEPARTMENT AT [facility] Provider Note   CSN: [id number] Arrival date & time: 03/05/24  2321     Patient presents with: Drug Overdose   Mary [name] is a 33 y.o. female.   The history is provided by the EMS personnel and a friend. The history is limited by the condition of the patient.  Drug Overdose This is a new problem. The problem occurs constantly. The problem has not changed since onset.Pertinent negatives include no chest pain, no abdominal pain, no headaches and no shortness of breath. Nothing aggravates the symptoms. Nothing relieves the symptoms. She has tried nothing for the symptoms. The treatment provided moderate relief.  Patient reportedly injected cocaine but had a reaction she has never had before.  No alcohol for 10 years.    No past medical history on file.    Prior to Admission medications   Not on File    Allergies: Patient has no allergy information on record.    Review of Systems  Unable to perform ROS: Acuity of condition  Respiratory:  Negative for shortness of breath.   Cardiovascular:  Negative for chest pain.  Gastrointestinal:  Negative for abdominal pain.  Neurological:  Negative for headaches.  Psychiatric/Behavioral:  Negative for self-injury. The patient is not nervous/anxious.     Updated Vital Signs BP 113/75   Pulse 68   Temp 98.2 F (36.8 C) (Oral)   Resp 13   SpO2 100%   Physical Exam Vitals and nursing note reviewed.  Constitutional:      General: She is not in acute distress.    Appearance: Normal appearance. She is well-developed.  HENT:     Head: Normocephalic and atraumatic.     Nose: Nose normal.  Eyes:     Pupils: Pupils are equal, round, and reactive to light.  Cardiovascular:     Rate and Rhythm: Regular rhythm. Tachycardia present.     Pulses: Normal pulses.     Heart sounds: Normal heart sounds.  Pulmonary:     Effort: Pulmonary effort is normal. No respiratory distress.      Breath sounds: Normal breath sounds.  Abdominal:     General: Bowel sounds are normal. There is no distension.     Palpations: Abdomen is soft.     Tenderness: There is no abdominal tenderness. There is no guarding or rebound.  Musculoskeletal:        General: Normal range of motion.     Cervical back: Normal range of motion and neck supple.  Skin:    General: Skin is warm and dry.     Capillary Refill: Capillary refill takes less than 2 seconds.     Findings: No erythema or rash.  Neurological:     General: No focal deficit present.     Mental Status: She is alert.     Deep Tendon Reflexes: Reflexes normal.     Comments: Writhing movements   Psychiatric:        Mood and Affect: Mood normal.     (all labs ordered are listed, but only abnormal results are displayed) Results for orders placed or performed during the hospital encounter of 03/05/24  Comprehensive metabolic panel   Collection Time: 03/06/24 12:14 AM  Result Value Ref Range   Sodium 140 135 - 145 mmol/L   Potassium 3.9 3.5 - 5.1 mmol/L   Chloride 103 98 - 111 mmol/L   CO2 26 22 - 32 mmol/L   Glucose, Bld 87 70 -  99 mg/dL   BUN 19 6 - 20 mg/dL   Creatinine, Ser 9.32 0.44 - 1.00 mg/dL   Calcium 9.4 8.9 - 89.6 mg/dL   Total Protein 7.7 6.5 - 8.1 g/dL   Albumin 4.3 3.5 - 5.0 g/dL   AST 49 (H) 15 - 41 U/L   ALT 50 (H) 0 - 44 U/L   Alkaline Phosphatase 94 38 - 126 U/L   Total Bilirubin 0.2 0.0 - 1.2 mg/dL   GFR, Estimated >39 >39 mL/min   Anion gap 11 5 - 15  Ethanol   Collection Time: 03/06/24 12:14 AM  Result Value Ref Range   Alcohol, Ethyl (B) <15 <15 mg/dL  cbc   Collection Time: 03/06/24 12:14 AM  Result Value Ref Range   WBC 13.3 (H) 4.0 - 10.5 K/uL   RBC 4.24 3.87 - 5.11 MIL/uL   Hemoglobin 10.5 (L) 12.0 - 15.0 g/dL   HCT 64.3 (L) 63.9 - 53.9 %   MCV 84.0 80.0 - 100.0 fL   MCH 24.8 (L) 26.0 - 34.0 pg   MCHC 29.5 (L) 30.0 - 36.0 g/dL   RDW 81.7 (H) 88.4 - 84.4 %   Platelets 431 (H) 150 - 400 K/uL    nRBC 0.0 0.0 - 0.2 %  hCG, serum, qualitative   Collection Time: 03/06/24 12:14 AM  Result Value Ref Range   Preg, Serum NEGATIVE NEGATIVE  Salicylate level   Collection Time: 03/06/24 12:14 AM  Result Value Ref Range   Salicylate Lvl <7.0 (L) 7.0 - 30.0 mg/dL  Acetaminophen  level   Collection Time: 03/06/24 12:14 AM  Result Value Ref Range   Acetaminophen  (Tylenol ), Serum <10 (L) 10 - 30 ug/mL  Magnesium    Collection Time: 03/06/24 12:14 AM  Result Value Ref Range   Magnesium  2.0 1.7 - 2.4 mg/dL  CBG monitoring, ED   Collection Time: 03/06/24 12:34 AM  Result Value Ref Range   Glucose-Capillary 109 (H) 70 - 99 mg/dL  Rapid urine drug screen (hospital performed)   Collection Time: 03/06/24 12:37 AM  Result Value Ref Range   Opiates NEGATIVE NEGATIVE   Cocaine POSITIVE (A) NEGATIVE   Benzodiazepines NEGATIVE NEGATIVE   Amphetamines NEGATIVE NEGATIVE   Tetrahydrocannabinol POSITIVE (A) NEGATIVE   Barbiturates NEGATIVE NEGATIVE   Methadone Scn, Ur NEGATIVE NEGATIVE   Fentanyl  POSITIVE (A) NEGATIVE   No results found.   EKG: None  Radiology: No results found.   Procedures   Medications Ordered in the ED  sodium chloride  0.9 % bolus 500 mL (500 mLs Intravenous New Bag/Given 03/06/24 0017)  benztropine mesylate (COGENTIN) injection 1 mg (1 mg Intramuscular Given 03/06/24 0000)                                    Medical Decision Making Patient with feeling off post injecting what she thought was cocaine, no SI  Amount and/or Complexity of Data Reviewed Independent Historian: friend    Details: See above  External Data Reviewed: notes.    Details: Previous notes reviewed  Labs: ordered.    Details: Pregnancy is negative.  UDS is positive for cocaine, fentanyl  and THC. Negative tylenol  and salicylate.  Negative ETOH, normal sodium 140, normal potassium 3.9, normal creatinine   Risk Prescription drug management. Risk Details: Sleeping soundly in the room.   Arouses tro verbal stimuli.  Denies SI or HI.  PO challenged in the room.  Stable  for discharge with close follow up      Final diagnoses:  Polysubstance abuse (HCC)   No signs of systemic illness or infection. The patient is nontoxic-appearing on exam and vital signs are within normal limits.  I have reviewed the triage vital signs and the nursing notes. Pertinent labs & imaging results that were available during my care of the patient were reviewed by me and considered in my medical decision making (see chart for details). After history, exam, and medical workup I feel the patient has been appropriately medically screened and is safe for discharge home. Pertinent diagnoses were discussed with the patient. Patient was given return precautions.  ED Discharge Orders     None          Eyoel Throgmorton, MD 03/06/24 [phone]

## 2024-03-06 NOTE — ED Notes (Signed)
 Significant other left numbers to reach him when patient is discharged:  [phone]  [phone]

## 2024-03-10 ENCOUNTER — Encounter: Payer: MEDICAID | Admitting: Physical Therapy

## 2024-03-12 ENCOUNTER — Encounter: Payer: MEDICAID | Admitting: Physical Therapy

## 2024-03-17 ENCOUNTER — Encounter: Payer: MEDICAID | Admitting: Physical Therapy

## 2024-03-20 ENCOUNTER — Encounter: Payer: MEDICAID | Admitting: Physical Therapy

## 2024-03-23 ENCOUNTER — Encounter: Payer: MEDICAID | Admitting: Physical Therapy

## 2024-03-25 ENCOUNTER — Emergency Department (HOSPITAL_COMMUNITY)
Admission: EM | Admit: 2024-03-25 | Discharge: 2024-03-25 | Discharge status: Against Medical Advice | Payer: MEDICAID | Attending: Emergency Medicine | Admitting: Emergency Medicine

## 2024-03-25 ENCOUNTER — Emergency Department (HOSPITAL_COMMUNITY): Payer: MEDICAID

## 2024-03-25 DIAGNOSIS — N898 Other specified noninflammatory disorders of vagina: Secondary | ICD-10-CM | POA: Diagnosis not present

## 2024-03-25 DIAGNOSIS — Z5329 Procedure and treatment not carried out because of patient's decision for other reasons: Secondary | ICD-10-CM | POA: Diagnosis not present

## 2024-03-25 DIAGNOSIS — R000 Tachycardia, unspecified: Secondary | ICD-10-CM | POA: Diagnosis not present

## 2024-03-25 DIAGNOSIS — Z7901 Long term (current) use of anticoagulants: Secondary | ICD-10-CM | POA: Insufficient documentation

## 2024-03-25 DIAGNOSIS — R079 Chest pain, unspecified: Secondary | ICD-10-CM | POA: Insufficient documentation

## 2024-03-25 DIAGNOSIS — J45909 Unspecified asthma, uncomplicated: Secondary | ICD-10-CM | POA: Insufficient documentation

## 2024-03-25 LAB — BASIC METABOLIC PANEL WITH GFR
Anion gap: 16 — ABNORMAL HIGH (ref 5–15)
BUN: 19 mg/dL (ref 6–20)
CO2: 22 mmol/L (ref 22–32)
Calcium: 10.1 mg/dL (ref 8.9–10.3)
Chloride: 97 mmol/L — ABNORMAL LOW (ref 98–111)
Creatinine, Ser: 0.7 mg/dL (ref 0.44–1.00)
GFR, Estimated: >60 mL/min (ref >60)
Glucose, Bld: 117 mg/dL — ABNORMAL HIGH (ref 70–99)
Potassium: 4.4 mmol/L (ref 3.5–5.1)
Sodium: 135 mmol/L (ref 135–145)

## 2024-03-25 LAB — CBC
HCT: 40.7 % (ref 36.0–46.0)
Hemoglobin: 12.7 g/dL (ref 12.0–15.0)
MCH: 25.5 pg — ABNORMAL LOW (ref 26.0–34.0)
MCHC: 31.2 g/dL (ref 30.0–36.0)
MCV: 81.6 fL (ref 80.0–100.0)
Platelets: 402 K/uL — ABNORMAL HIGH (ref 150–400)
RBC: 4.99 MIL/uL (ref 3.87–5.11)
RDW: 18.7 % — ABNORMAL HIGH (ref 11.5–15.5)
WBC: 14.4 K/uL — ABNORMAL HIGH (ref 4.0–10.5)
nRBC: 0 % (ref 0.0–0.2)

## 2024-03-25 LAB — WET PREP, GENITAL
Clue Cells Wet Prep HPF POC: NONE SEEN
Sperm: NONE SEEN
Trich, Wet Prep: NONE SEEN
WBC, Wet Prep HPF POC: <10 (ref <10)
Yeast Wet Prep HPF POC: NONE SEEN

## 2024-03-25 LAB — URINALYSIS, ROUTINE W REFLEX MICROSCOPIC
Bilirubin Urine: NEGATIVE
Glucose, UA: NEGATIVE mg/dL
Hgb urine dipstick: NEGATIVE
Ketones, ur: NEGATIVE mg/dL
Leukocytes,Ua: NEGATIVE
Nitrite: NEGATIVE
Protein, ur: NEGATIVE mg/dL
Specific Gravity, Urine: 1.013 (ref 1.005–1.030)
pH: 6 (ref 5.0–8.0)

## 2024-03-25 LAB — TROPONIN T, HIGH SENSITIVITY: Troponin T High Sensitivity: <15 ng/L (ref 0–19)

## 2024-03-25 LAB — D-DIMER, QUANTITATIVE: D-Dimer, Quant: 0.86 ug{FEU}/mL — ABNORMAL HIGH (ref 0.00–0.50)

## 2024-03-25 LAB — HCG, SERUM, QUALITATIVE: Preg, Serum: NEGATIVE

## 2024-03-25 MED ORDER — DOXYCYCLINE HYCLATE 100 MG PO TABS
100.0000 mg | ORAL_TABLET | Freq: Once | ORAL | Status: AC
  Administered 2024-03-25: 100 mg via ORAL
  Filled 2024-03-25: qty 1

## 2024-03-25 MED ORDER — CEFTRIAXONE SODIUM 1 G IJ SOLR
500.0000 mg | Freq: Once | INTRAMUSCULAR | Status: AC
  Administered 2024-03-25: 500 mg via INTRAMUSCULAR
  Filled 2024-03-25: qty 10

## 2024-03-25 NOTE — ED Provider Notes (Signed)
 [place] EMERGENCY DEPARTMENT AT [facility] Provider Note   CSN: [id number] Arrival date & time: 03/25/24  2155     Patient presents with: Chest Pain and Vaginal Discharge   Mary [name] is a 33 y.o. female with past medical history of asthma, polysubstance abuse presents emergency department for evaluation of chest pain, shortness of breath.  Also endorses that she has been coughing up black stuff for a week.  Chest pain worsens with deep breath, coughing.  Did use cocaine today  Also complains of vaginal discharge that she has noticed for the past week.  Has been using Monistat at home without relief.  Also endorses concern for STD.    Chest Pain Vaginal Discharge      Prior to Admission medications   Medication Sig Start Date End Date Taking? Authorizing Provider  acetaminophen  (TYLENOL ) 500 MG tablet Take 1,000 mg by mouth every 6 (six) hours as needed for moderate pain (pain score 4-6). Patient not taking: Reported on 01/29/2024    [provider]  acetaminophen  (TYLENOL ) 500 MG tablet Take 2 tablets (1,000 mg total) by mouth every 6 (six) hours as needed for mild pain (pain score 1-3) or headache. Patient not taking: Reported on 01/29/2024 11/22/23   Vicci Burnard SAUNDERS, PA-C  albuterol  (PROVENTIL  HFA;VENTOLIN  HFA) 108 (90 BASE) MCG/ACT inhaler Inhale 2 puffs into the lungs every 4 (four) hours as needed for wheezing or shortness of breath. 07/12/13   Haze Lonni PARAS, MD  apixaban  (ELIQUIS ) 2.5 MG TABS tablet Take 1 tablet (2.5 mg total) by mouth 2 (two) times daily. Patient not taking: Reported on 01/29/2024 11/22/23 12/22/23  Deward Eck, PA-C  chlorhexidine  (PERIDEX ) 0.12 % solution Use as directed 15 mLs in the mouth or throat 2 (two) times daily. 12/18/19   Palumbo, April, MD  cyclobenzaprine  (FLEXERIL ) 10 MG tablet Take 1 tablet (10 mg total) by mouth 3 (three) times daily. 12/09/17   Armida Culver, PA-C  dicyclomine  (BENTYL ) 10 MG capsule Take 1  capsule (10 mg total) by mouth 3 (three) times daily as needed for up to 5 days for spasms. 10/22/23 10/27/23  Levander Slate, MD  docusate sodium  (COLACE) 100 MG capsule Take 1 capsule (100 mg total) by mouth daily as needed for mild constipation. 11/22/23   Vicci Burnard SAUNDERS, PA-C  gabapentin  (NEURONTIN ) 300 MG capsule Take 2 capsules (600 mg total) by mouth 3 (three) times daily. 11/22/23 12/22/23  Vicci Burnard SAUNDERS, PA-C  hydrOXYzine  (VISTARIL ) 50 MG capsule Take 1 capsule (50 mg total) by mouth 3 (three) times daily as needed. 10/22/23   Levander Slate, MD  ibuprofen  (ADVIL ) 200 MG tablet Take 200 mg by mouth every 6 (six) hours as needed for moderate pain (pain score 4-6).    [provider]  ibuprofen  (ADVIL ) 600 MG tablet Take 1 tablet (600 mg total) by mouth every 8 (eight) hours as needed for moderate pain (pain score 4-6). 11/22/23   Vicci Burnard SAUNDERS, PA-C  ibuprofen  (ADVIL ,MOTRIN ) 400 MG tablet Take 1 tablet (400 mg total) by mouth every 6 (six) hours as needed. 12/09/17   Armida Culver, PA-C  lidocaine  (LIDODERM ) 5 % Place 3 patches onto the skin daily. Remove & Discard patch within 12 hours or as directed by MD 11/22/23   Vicci Burnard SAUNDERS, PA-C  methocarbamol  (ROBAXIN ) 500 MG tablet Take 2 tablets (1,000 mg total) by mouth every 6 (six) hours as needed for muscle spasms. 11/22/23   Vicci Burnard SAUNDERS, PA-C  naproxen  (NAPROSYN ) 375 MG tablet Take 1 tablet (375 mg total) by mouth 2 (two) times daily with a meal. 12/18/19   Palumbo, April, MD  Oxycodone  HCl 10 MG TABS Take 1-1.5 tablets (10-15 mg total) by mouth every 4 (four) hours as needed for severe pain (pain score 7-10) or moderate pain (pain score 4-6) (10 mg for moderate, 15 mg for severe). 11/22/23   Vicci Burnard SAUNDERS, PA-C  polyethylene glycol (MIRALAX  / GLYCOLAX ) 17 g packet Take 17 g by mouth daily as needed for mild constipation. 11/22/23   Vicci Burnard SAUNDERS, PA-C  potassium chloride  SA (KLOR-CON  M) 20 MEQ tablet Take 1 tablet (20 mEq total) by mouth  2 (two) times daily for 3 days. 10/22/23 10/25/23  Levander Slate, MD  valACYclovir  (VALTREX ) 1000 MG tablet Take 1 daily bid for 5 days then 1 daily 01/25/15   Signa Delon LABOR, NP    Allergies: Hydrocodone , Raspberry, Tramadol , and Other    Review of Systems  Cardiovascular:  Positive for chest pain.  Genitourinary:  Positive for vaginal discharge.    Updated Vital Signs BP 108/82   Pulse (!) 134   Temp 98.7 F (37.1 C)   Resp 10   LMP  (LMP Unknown)   SpO2 97%   Physical Exam Vitals and nursing note reviewed. Exam conducted with a chaperone present.  Constitutional:      General: She is not in acute distress.    Appearance: Normal appearance.  HENT:     Head: Normocephalic and atraumatic.  Eyes:     Conjunctiva/sclera: Conjunctivae normal.  Cardiovascular:     Rate and Rhythm: Tachycardia present.     Pulses: Normal pulses.     Heart sounds: Normal heart sounds.  Pulmonary:     Effort: Pulmonary effort is normal. No respiratory distress.     Breath sounds: Normal breath sounds.  Genitourinary:    Exam position: Knee-chest position.     Comments: Thick white discharge.  No CMT tenderness.  No bleeding Musculoskeletal:     Cervical back: Normal range of motion and neck supple. No rigidity.  Skin:    Coloration: Skin is not jaundiced or pale.  Neurological:     Mental Status: She is alert and oriented to person, place, and time. Mental status is at baseline.   GU exam chaperoned with Alexia RN  (all labs ordered are listed, but only abnormal results are displayed) Labs Reviewed  BASIC METABOLIC PANEL WITH GFR - Abnormal; Notable for the following components:      Result Value   Chloride 97 (*)    Glucose, Bld 117 (*)    Anion gap 16 (*)    All other components within normal limits  CBC - Abnormal; Notable for the following components:   WBC 14.4 (*)    MCH 25.5 (*)    RDW 18.7 (*)    Platelets 402 (*)    All other components within normal limits  D-DIMER,  QUANTITATIVE - Abnormal; Notable for the following components:   D-Dimer, Quant 0.86 (*)    All other components within normal limits  WET PREP, GENITAL  HCG, SERUM, QUALITATIVE  URINALYSIS, ROUTINE W REFLEX MICROSCOPIC  HIV ANTIBODY (ROUTINE TESTING W REFLEX)  URINE DRUG SCREEN  GC/CHLAMYDIA PROBE AMP ([place]) NOT AT [facility]  TROPONIN T, HIGH SENSITIVITY  TROPONIN T, HIGH SENSITIVITY    EKG: None  Radiology: DG Chest 2 View Result Date: 03/25/2024 EXAM: 2 VIEW(S) XRAY OF THE CHEST 03/25/2024 10:22:00  PM COMPARISON: 10/2023 CLINICAL HISTORY: chest pain. Per chart: Patient c/o Chest pain x 2 hours. Patient report pain radiates to her right shoulder. Patient report cocaine use today. FINDINGS: LUNGS AND PLEURA: No focal pulmonary opacity. No pulmonary edema. No pleural effusion. No pneumothorax. HEART AND MEDIASTINUM: No acute abnormality of the cardiac and mediastinal silhouettes. BONES AND SOFT TISSUES: No acute osseous abnormality. IMPRESSION: 1. No acute cardiopulmonary process. Electronically signed by: Franky Crease MD 03/25/2024 10:29 PM EDT RP Workstation: [id number]     Medications Ordered in the ED  cefTRIAXone  (ROCEPHIN ) injection 500 mg (500 mg Intramuscular Given 03/25/24 2310)  doxycycline  (VIBRA -TABS) tablet 100 mg (100 mg Oral Given 03/25/24 2310)                                    Medical Decision Making Amount and/or Complexity of Data Reviewed Labs: ordered. Radiology: ordered.  Risk Prescription drug management.   Patient presents to the ED for concern of CP, shob, vaginal discharge this involves an extensive number of treatment options, and is a complaint that carries with it a high risk of complications and morbidity.  The differential diagnosis includes ACS, PE, dissection, arrhythmia, STD, yeast infection, BV, UTI, PID   Co morbidities that complicate the patient evaluation  Polysubstance abuse See hpi   Additional history obtained:  Additional  history obtained from Nursing   External records from outside source obtained and reviewed including triage RN note   Lab Tests:  I Ordered, and personally interpreted labs.  The pertinent results include:   Troponin negative Dimer 0.86 Wet prep negative for yeast, infection   Imaging Studies ordered:  I ordered imaging studies including CXR, CTPE I independently visualized and interpreted CXR which showed No acute cardiopulmonary process  I agree with the radiologist interpretation CTPE was not performed prior to elopement   Cardiac Monitoring:  The patient was maintained on a cardiac monitor.  I personally viewed and interpreted the cardiac monitored which showed an underlying rhythm of: Sinus tachycardia with no ST nor T wave abnormalities   Medicines ordered and prescription drug management:  I ordered medication including rocephin , doxy  for STD treatment  Reevaluation of the patient after these medicines showed that the patient stayed the same I have reviewed the patients home medicines and have made adjustments as needed     Problem List / ED Course:  CP SHOB EKG sinus tachycardia with no obvious ST nor T wave abnormalities Maintaining oxygen saturation without supplementation.  No signs of respiratory distress.  Lung sounds CTAB. Tachycardic. No recent travel, hx of PE, OCP, pedal edema.  Dimer positive.  Plan to obtain CT PE to rule out PE as etiology of tachycardia however patient eloped prior to completing this First troponin negative.  Did not obtain second troponin prior to elope Tachycardia may be related to recent cocaine use  Vaginal discharge Concern for STD No CMT tenderness.  No abdominal tenderness.  Low suspicion for PID.   White thick vaginal discharge.  Wet prep negative for yeast, BV.  Provided 1 dose of Doxy, Rocephin  for STD prophylaxis  Dysuria No flank pain.  No fevers no reported fevers.  Low suspicion for stone, pyelo UA without  infection May be related to STD.  This will result in next 24 hours   Reevaluation:  Did not reassess patient prior to elopement   Dispostion:  Patient fully alert and oriented.  Able to make her own medical decisions.  Not a harm to herself or others.  Patient eloped from emergency department prior to completion of ED workup.  Was planning to get CT PE study as her dimer was elevated and she is tachycardic.  I did discuss with her that we needed to get CT and she was aware of this.  Vital signs notable for tachycardia but no hypotension.  She was able to ambulate on her own without difficulty.  We were able to get IV out before she eloped from emergency department.   Final diagnoses:  None    ED Discharge Orders     None        Minnie Tinnie BRAVO, PA 03/25/24 2347    Bari Roxie HERO, DO 03/26/24 1702

## 2024-03-25 NOTE — ED Notes (Signed)
 Patient transported to X-ray

## 2024-03-25 NOTE — ED Triage Notes (Signed)
 Patient c/o Chest pain x 2 hours. Patient report pain radiates to her right shoulder. Patient report vaginal discharge and dysuria x 2 weeks and wants to be check for STD. Patient report cocaine use today. Hx Poly substance abuse.

## 2024-03-25 NOTE — ED Notes (Signed)
 Pt seen walking out of ER stating she did not want to stay. Pt IV was removed by pt and IV cathter was seen on bedside table in pt room. No IV seen in arm at time of departure.

## 2024-03-26 ENCOUNTER — Ambulatory Visit (HOSPITAL_COMMUNITY): Payer: Self-pay

## 2024-03-26 ENCOUNTER — Encounter: Payer: MEDICAID | Admitting: Physical Therapy

## 2024-03-26 LAB — URINE DRUG SCREEN
Amphetamines: POSITIVE — AB
Barbiturates: NEGATIVE
Benzodiazepines: NEGATIVE
Cocaine: POSITIVE — AB
Fentanyl: POSITIVE — AB
Methadone Scn, Ur: NEGATIVE
Opiates: NEGATIVE
Tetrahydrocannabinol: NEGATIVE

## 2024-03-26 LAB — HIV ANTIBODY (ROUTINE TESTING W REFLEX): HIV Screen 4th Generation wRfx: NONREACTIVE

## 2024-03-27 LAB — GC/CHLAMYDIA PROBE AMP (~~LOC~~) NOT AT ARMC
Chlamydia: POSITIVE — AB
Comment: NEGATIVE
Comment: NORMAL
Neisseria Gonorrhea: NEGATIVE

## 2024-04-15 ENCOUNTER — Ambulatory Visit: Payer: MEDICAID | Admitting: Nurse Practitioner
